# Patient Record
Sex: Female | Born: 1935 | Race: White | Hispanic: No | Smoking: Former smoker
Health system: Southern US, Community
[De-identification: ages and names within clinical notes are randomized; demographics above are authoritative.]

## PROBLEM LIST (undated history)

## (undated) DIAGNOSIS — T7840XA Allergy, unspecified, initial encounter: Secondary | ICD-10-CM

## (undated) DIAGNOSIS — C801 Malignant (primary) neoplasm, unspecified: Secondary | ICD-10-CM

## (undated) DIAGNOSIS — J449 Chronic obstructive pulmonary disease, unspecified: Secondary | ICD-10-CM

## (undated) DIAGNOSIS — M1990 Unspecified osteoarthritis, unspecified site: Secondary | ICD-10-CM

## (undated) DIAGNOSIS — S2231XD Fracture of one rib, right side, subsequent encounter for fracture with routine healing: Secondary | ICD-10-CM

## (undated) DIAGNOSIS — N200 Calculus of kidney: Secondary | ICD-10-CM

## (undated) HISTORY — PX: TONSILLECTOMY: SUR1361

## (undated) HISTORY — DX: Allergy, unspecified, initial encounter: T78.40XA

## (undated) HISTORY — DX: Fracture of one rib, right side, subsequent encounter for fracture with routine healing: S22.31XD

## (undated) HISTORY — PX: APPENDECTOMY: SHX54

---
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
## 1972-03-20 HISTORY — PX: RETINAL DETACHMENT SURGERY: SHX105

## 1999-10-11 ENCOUNTER — Encounter: Admission: RE | Admit: 1999-10-11 | Discharge: 1999-10-11 | Payer: Self-pay | Admitting: Family Medicine

## 1999-10-11 ENCOUNTER — Encounter: Payer: Self-pay | Admitting: Family Medicine

## 1999-10-25 ENCOUNTER — Encounter: Admission: RE | Admit: 1999-10-25 | Discharge: 1999-10-25 | Payer: Self-pay | Admitting: Family Medicine

## 1999-10-25 ENCOUNTER — Ambulatory Visit (HOSPITAL_COMMUNITY): Admission: RE | Admit: 1999-10-25 | Discharge: 1999-10-25 | Payer: Self-pay | Admitting: Family Medicine

## 1999-10-26 ENCOUNTER — Encounter: Admission: RE | Admit: 1999-10-26 | Discharge: 1999-10-26 | Payer: Self-pay | Admitting: Family Medicine

## 2000-07-24 ENCOUNTER — Encounter: Payer: Self-pay | Admitting: Family Medicine

## 2000-07-24 ENCOUNTER — Encounter: Admission: RE | Admit: 2000-07-24 | Discharge: 2000-07-24 | Payer: Self-pay | Admitting: Family Medicine

## 2000-08-21 ENCOUNTER — Encounter: Admission: RE | Admit: 2000-08-21 | Discharge: 2000-08-21 | Payer: Self-pay | Admitting: Family Medicine

## 2002-08-19 ENCOUNTER — Encounter: Admission: RE | Admit: 2002-08-19 | Discharge: 2002-08-19 | Payer: Self-pay | Admitting: Family Medicine

## 2002-08-27 ENCOUNTER — Encounter: Admission: RE | Admit: 2002-08-27 | Discharge: 2002-08-27 | Payer: Self-pay | Admitting: Family Medicine

## 2002-08-27 ENCOUNTER — Encounter: Payer: Self-pay | Admitting: Family Medicine

## 2004-01-19 ENCOUNTER — Ambulatory Visit: Payer: Self-pay | Admitting: Family Medicine

## 2004-07-27 ENCOUNTER — Encounter: Admission: RE | Admit: 2004-07-27 | Discharge: 2004-07-27 | Payer: Self-pay | Admitting: Family Medicine

## 2004-08-02 ENCOUNTER — Ambulatory Visit: Payer: Self-pay | Admitting: Family Medicine

## 2004-11-18 ENCOUNTER — Encounter (INDEPENDENT_AMBULATORY_CARE_PROVIDER_SITE_OTHER): Payer: Self-pay | Admitting: *Deleted

## 2004-11-29 ENCOUNTER — Ambulatory Visit: Payer: Self-pay | Admitting: Family Medicine

## 2004-11-29 ENCOUNTER — Encounter (INDEPENDENT_AMBULATORY_CARE_PROVIDER_SITE_OTHER): Payer: Self-pay | Admitting: *Deleted

## 2004-11-30 ENCOUNTER — Encounter: Admission: RE | Admit: 2004-11-30 | Discharge: 2004-11-30 | Payer: Self-pay | Admitting: Family Medicine

## 2004-12-05 ENCOUNTER — Encounter: Admission: RE | Admit: 2004-12-05 | Discharge: 2004-12-05 | Payer: Self-pay | Admitting: Family Medicine

## 2005-01-03 ENCOUNTER — Ambulatory Visit: Payer: Self-pay | Admitting: Family Medicine

## 2005-06-27 ENCOUNTER — Ambulatory Visit: Payer: Self-pay | Admitting: Sports Medicine

## 2006-04-25 ENCOUNTER — Encounter: Admission: RE | Admit: 2006-04-25 | Discharge: 2006-04-25 | Payer: Self-pay | Admitting: Family Medicine

## 2006-05-09 ENCOUNTER — Encounter: Admission: RE | Admit: 2006-05-09 | Discharge: 2006-05-09 | Payer: Self-pay | Admitting: Family Medicine

## 2006-05-17 DIAGNOSIS — K219 Gastro-esophageal reflux disease without esophagitis: Secondary | ICD-10-CM | POA: Insufficient documentation

## 2006-05-17 DIAGNOSIS — M1990 Unspecified osteoarthritis, unspecified site: Secondary | ICD-10-CM

## 2006-05-17 DIAGNOSIS — E785 Hyperlipidemia, unspecified: Secondary | ICD-10-CM | POA: Insufficient documentation

## 2006-05-17 DIAGNOSIS — M899 Disorder of bone, unspecified: Secondary | ICD-10-CM | POA: Insufficient documentation

## 2006-05-17 DIAGNOSIS — M949 Disorder of cartilage, unspecified: Secondary | ICD-10-CM

## 2006-05-17 DIAGNOSIS — L578 Other skin changes due to chronic exposure to nonionizing radiation: Secondary | ICD-10-CM | POA: Insufficient documentation

## 2006-05-18 ENCOUNTER — Encounter (INDEPENDENT_AMBULATORY_CARE_PROVIDER_SITE_OTHER): Payer: Self-pay | Admitting: *Deleted

## 2006-05-24 ENCOUNTER — Encounter: Payer: Self-pay | Admitting: Family Medicine

## 2006-05-29 ENCOUNTER — Ambulatory Visit: Payer: Self-pay | Admitting: Family Medicine

## 2006-05-29 DIAGNOSIS — E663 Overweight: Secondary | ICD-10-CM

## 2006-05-29 DIAGNOSIS — F4321 Adjustment disorder with depressed mood: Secondary | ICD-10-CM

## 2006-05-29 LAB — CONVERTED CEMR LAB: Hemoglobin: 14.3 g/dL

## 2006-05-31 ENCOUNTER — Telehealth: Payer: Self-pay | Admitting: Family Medicine

## 2006-05-31 LAB — CONVERTED CEMR LAB
AST: 17 units/L (ref 0–37)
Albumin: 4.3 g/dL (ref 3.5–5.2)
Alkaline Phosphatase: 55 units/L (ref 39–117)
BUN: 19 mg/dL (ref 6–23)
CO2: 26 meq/L (ref 19–32)
Chloride: 107 meq/L (ref 96–112)
Cholesterol: 236 mg/dL — ABNORMAL HIGH (ref 0–200)
Glucose, Bld: 104 mg/dL — ABNORMAL HIGH (ref 70–99)
LDL Cholesterol: 142 mg/dL — ABNORMAL HIGH (ref 0–99)
Sodium: 143 meq/L (ref 135–145)
Total Protein: 7.1 g/dL (ref 6.0–8.3)

## 2007-05-28 ENCOUNTER — Ambulatory Visit: Payer: Self-pay | Admitting: Family Medicine

## 2008-02-04 ENCOUNTER — Telehealth: Payer: Self-pay | Admitting: Family Medicine

## 2008-02-26 ENCOUNTER — Encounter: Payer: Self-pay | Admitting: Family Medicine

## 2008-02-26 ENCOUNTER — Ambulatory Visit: Payer: Self-pay | Admitting: Family Medicine

## 2008-02-26 LAB — CONVERTED CEMR LAB
Albumin: 4.2 g/dL (ref 3.5–5.2)
BUN: 21 mg/dL (ref 6–23)
Cholesterol: 257 mg/dL — ABNORMAL HIGH (ref 0–200)
Creatinine, Ser: 0.87 mg/dL (ref 0.40–1.20)
HDL: 67 mg/dL (ref >39)
Hemoglobin: 13.5 g/dL (ref 12.0–15.0)
LDL Cholesterol: 164 mg/dL — ABNORMAL HIGH (ref 0–99)
Potassium: 4.3 meq/L (ref 3.5–5.3)
RBC: 4.47 M/uL (ref 3.87–5.11)
Total Bilirubin: 0.5 mg/dL (ref 0.3–1.2)
Total CHOL/HDL Ratio: 3.8
Total Protein: 6.7 g/dL (ref 6.0–8.3)

## 2008-03-03 ENCOUNTER — Ambulatory Visit: Payer: Self-pay | Admitting: Family Medicine

## 2008-03-03 ENCOUNTER — Encounter: Admission: RE | Admit: 2008-03-03 | Discharge: 2008-03-03 | Payer: Self-pay | Admitting: Family Medicine

## 2008-03-03 DIAGNOSIS — L723 Sebaceous cyst: Secondary | ICD-10-CM | POA: Insufficient documentation

## 2008-03-03 DIAGNOSIS — M6740 Ganglion, unspecified site: Secondary | ICD-10-CM | POA: Insufficient documentation

## 2009-05-25 ENCOUNTER — Encounter: Admission: RE | Admit: 2009-05-25 | Discharge: 2009-05-25 | Payer: Self-pay | Admitting: Family Medicine

## 2009-06-01 ENCOUNTER — Ambulatory Visit: Payer: Self-pay | Admitting: Family Medicine

## 2009-06-01 DIAGNOSIS — M479 Spondylosis, unspecified: Secondary | ICD-10-CM | POA: Insufficient documentation

## 2009-06-01 LAB — CONVERTED CEMR LAB
ALT: 16 units/L (ref 0–35)
AST: 21 units/L (ref 0–37)
Alkaline Phosphatase: 55 units/L (ref 39–117)
Bilirubin Urine: NEGATIVE
CO2: 27 meq/L (ref 19–32)
Chloride: 103 meq/L (ref 96–112)
Cholesterol: 229 mg/dL — ABNORMAL HIGH (ref 0–200)
Creatinine, Ser: 1.07 mg/dL (ref 0.40–1.20)
Hemoglobin: 14 g/dL (ref 12.0–15.0)
Ketones, urine, test strip: NEGATIVE
Potassium: 4.1 meq/L (ref 3.5–5.3)
Protein, U semiquant: NEGATIVE
RDW: 13.7 % (ref 11.5–15.5)
Total CHOL/HDL Ratio: 3.3
Total CK: 55 units/L (ref 7–177)
Triglycerides: 134 mg/dL (ref <150)
Vit D, 25-Hydroxy: 38 ng/mL (ref 30–89)
pH: 8

## 2009-06-02 ENCOUNTER — Encounter: Payer: Self-pay | Admitting: Family Medicine

## 2009-06-23 ENCOUNTER — Encounter: Payer: Self-pay | Admitting: Family Medicine

## 2009-06-25 ENCOUNTER — Encounter: Payer: Self-pay | Admitting: Family Medicine

## 2009-06-25 LAB — CONVERTED CEMR LAB: OCCULT 3: NEGATIVE

## 2009-07-12 ENCOUNTER — Ambulatory Visit: Payer: Self-pay | Admitting: Family Medicine

## 2009-08-05 ENCOUNTER — Ambulatory Visit: Payer: Self-pay | Admitting: Family Medicine

## 2009-08-05 ENCOUNTER — Encounter: Admission: RE | Admit: 2009-08-05 | Discharge: 2009-08-05 | Payer: Self-pay | Admitting: Family Medicine

## 2009-08-09 ENCOUNTER — Telehealth: Payer: Self-pay | Admitting: Family Medicine

## 2009-08-09 DIAGNOSIS — R930 Abnormal findings on diagnostic imaging of skull and head, not elsewhere classified: Secondary | ICD-10-CM

## 2009-08-23 ENCOUNTER — Encounter: Admission: RE | Admit: 2009-08-23 | Discharge: 2009-08-23 | Payer: Self-pay | Admitting: Family Medicine

## 2010-01-23 ENCOUNTER — Encounter: Payer: Self-pay | Admitting: Family Medicine

## 2010-03-20 HISTORY — PX: SQUAMOUS CELL CARCINOMA EXCISION: SHX2433

## 2010-04-10 ENCOUNTER — Encounter: Payer: Self-pay | Admitting: Family Medicine

## 2010-04-19 NOTE — Assessment & Plan Note (Signed)
Summary: problem review   

## 2010-04-19 NOTE — Assessment & Plan Note (Signed)
Summary: continued cough   Vital Signs:  Patient profile:   77 year old female Height:      64 inches Weight:      174 pounds BMI:     29.97 BSA:     1.85 O2 Sat:      97 % on Room air Temp:     98.0 degrees F Pulse rate:   76 / minute BP sitting:   148 / 85  Vitals Entered By: Golden Circle, RN (Aug 05, 2009 8:30 AM)  O2 Flow:  Room air CC: f/u SOB Is Patient Diabetic? No Pain Assessment Patient in pain? yes      Intensity: 5   CC:  f/u SOB.  History of Present Illness: Pt reports that cough is a little better. It has moved down in her chest and is coughing up green stuff.  Feels fatigued.  Still with some coughing.  NO fevers, but some chills.  Feels SOB.  Has pain in chest assoc with cough.  + wheezing.  No hemoptyisis.  Decreased appetite.  Dry foods cause the coughing spells.  Still with coughing spells like before, but less frequent.  + voice changes, now  hoarse.   Quit smoking 17 years ago.  Husband, who died of lung CA, was diagnosed exactly 17 years after he quit smoking.  Pt quite concerned about her lack of energy, feels she just can't do the things she wants to do such as yard work and caring for her house.  Allergies: 1)  ! Sulfa 2)  ! Pneumovax 23 3)  Codeine  Past History:  Past medical, family and social histories (including risk factors) reviewed, and no changes noted (except as noted below).  Past Medical History: Reviewed history from 05/24/2006 and no changes required. BMI 28.2 (26.9 on 6/04) - 01/19/2004,  TSH normal - 07/30/2000,  Xray - L A-C joint+ - 07/24/2000  Past Surgical History: Reviewed history from 05/31/2006 and no changes required. Tonsillectomy as a child  L breast bx benign - 03/20/1952 Retinal detachment R eye - 1974  Family History: Reviewed history from 07/12/2009 and no changes required. Heart, DM - MGF HTN - F who is living at age 57 with only mild memory loss, s/p Parathyroidectomy  M age 71 GERD & osteoporosis  Aunt  in 31s with 2 stents placed  Social History: Reviewed history from 07/12/2009 and no changes required. Widowed; Husband died of lung CA 18-Aug-1997; dates a friend Her mother, born 10, lives nearby. Father born 65 lives in [place], [place].  Daughter, lives in [place] Retired from Dealer office  Quit smoking 1994  Review of Systems       see HPI  Physical Exam  General:  Well-developed,well-nourished,in no acute distress; alert,appropriate and cooperative throughout examination.  Vitals noted Mouth:  Cobblestoning in post phayynx. O/w normal exam with good dentition, MMM Lungs:  Pre BJY:NWGNFA respiratory effort, chest expands symmetrically. No crackles, but scattered exp wheezes throughout. No dullness to percussion noted. Post neb: Still with occ wheezes, but improved. Heart:  Normal rate and regular rhythm. S1 and S2 normal without gallop, murmur, click, rub or other extra sounds.   Impression & Recommendations:  Problem # 1:  COUGH (ICD-786.2) Pt with continued cough for just over 4 weeks.  Given her paroxysmal coughing, pertussis is in differential.  H/o of smoking is concerning for possible malignancy.  Will rx azithromycin, though unlikely to be of benefit even if this is pertussis.  Check CXR given her  prolonged symptoms.  F/u 1 week with either Dr. Sheffield Slider or me.  Call us sooner if worsens. Since she did improve with neb treatment, will rx albuterol and spacer.   Orders: Albuterol Sulfate Sol 1mg  unit dose (Z6109) CXR- 2view (CXR) [facility]- Est Level  3 (60454)  Complete Medication List: 1)  Biotin 5 Mg Caps (Biotin) .... Take one tablet daily 2)  Caltrate 600+d 600-400 Mg-unit Tabs (Calcium carbonate-vitamin d) .... Take 1 tab two times a day 3)  Fish Oil 1000 Mg Caps (Omega-3 fatty acids) .... Take 1 tab two times a day 4)  Flax Seed Oil 1000 Mg Caps (Flaxseed (linseed)) .... Take one daily 5)  Advil 200 Mg Tabs (Ibuprofen) .... Take 2 at bedtime. 6)  Anacin 400-32 Mg Tabs  (Aspirin-caffeine) .... Take 1 tab every a.m. 7)  Tessalon Perles 100 Mg Caps (Benzonatate) .Marland Kitchen.. 1-2 by mouth three times a day as needed for cough 8)  Azithromycin 250 Mg Tabs (Azithromycin) .... Take 2 by mouth today, then 1 by mouth daily for 4 more days. 9)  Ventolin Hfa 108 (90 Base) Mcg/act Aers (Albuterol sulfate) .... 2 puffs with space every 6 hours as needed for wheezing 10)  Optichamber Advantage Misc (Spacer/aero-holding chambers) .... Use with inhaler  Patient Instructions: 1)  Please come see me or Dr. Sheffield Slider next week. 2)  Try the antibiotics.  You should take them for 5 days total. 3)  Try the inhaler to help with your breathing. 4)  If you feel worse, please call us or go to urgent care. 5)  I will check on your chest x-ray results. Prescriptions: OPTICHAMBER ADVANTAGE  MISC (SPACER/AERO-HOLDING CHAMBERS) Use with inhaler  #1 x 0   Entered and Authorized by:   Sarah Swaziland MD   Signed by:   Sarah Swaziland MD on 08/05/2009   Method used:   Print then Give to Patient   RxID:   [id number] VENTOLIN HFA 108 (90 BASE) MCG/ACT AERS (ALBUTEROL SULFATE) 2 puffs with space every 6 hours as needed for wheezing  #1 x 1   Entered and Authorized by:   Sarah [place] MD   Signed by:   Sarah [place]azil[place] MD on 08/05/2009   Method used:   Print then Give to Patient   RxID:   [id number] AZITHROMYCIN 250 MG TABS (AZITHROMYCIN) take 2 by mouth today, then 1 by mouth daily for 4 more days.  #6 x 0   Entered and Authorized by:   Sarah [place]az[place] MD   Signed by:   Sarah [place]azil[place] MD on 08/05/2009   Method used:   Print then Give to Patient   RxID:   [id number]    Medication Administration  Medication # 1:    Medication: Albuterol Sulfate Sol 1mg  unit dose    Diagnosis: COUGH (ICD-786.2)    Dose: 2.5 mg    Route: inhaled    Exp Date: 02/18/2011    Lot #: [id number]    Mfr: Nephron    Patient tolerated medication without complications    Given by: Garen Grams LPN (Aug 05, 2009 8:51  AM)  Orders Added: 1)  Albuterol Sulfate Sol 1mg  unit dose [J7613] 2)  CXR- 2view [CXR] 3)  [facility]- Est Level  3 [42595]

## 2010-04-19 NOTE — Assessment & Plan Note (Signed)
Summary: coughing,tcb   Vital Signs:  Patient profile:   77 year old female Height:      64 inches Weight:      173 pounds BMI:     29.80 Temp:     97.9 degrees F oral Pulse rate:   112 / minute BP sitting:   150 / 83  (left arm) Cuff size:   regular  Vitals Entered By: Tessie Fass CMA (July 12, 2009 11:31 AM) CC: chronic cough x 8 days Is Patient Diabetic? No Pain Assessment Patient in pain? no        CC:  chronic cough x 8 days.  History of Present Illness: 77 yo female recently returned from a cruise here with cough for 8 days.  Non-productive.  Feels like it's tight in her throat, cough caused when she takes a deep breath and then cough reflex starts.  Cough prolonged when she does have it.  No posttussive emesis.  + tears in eyes because she is coughing so hard.   No fevers (low grade temp for a few days to 100).  NO myalgias, no rhinorrhea. No chest pain or dyspnea.  Denies postnasal drip.  Habits & Providers  Alcohol-Tobacco-Diet     Tobacco Status: quit  Current Medications (verified): 1)  Biotin 5 Mg Caps (Biotin) .... Take One Tablet Daily 2)  Caltrate 600+d 600-400 Mg-Unit Tabs (Calcium Carbonate-Vitamin D) .... Take 1 Tab Two Times A Day 3)  Fish Oil 1000 Mg Caps (Omega-3 Fatty Acids) .... Take 1 Tab Two Times A Day 4)  Flax Seed Oil 1000 Mg Caps (Flaxseed (Linseed)) .... Take One Daily 5)  Advil 200 Mg Tabs (Ibuprofen) .... Take 2 At Bedtime. 6)  Anacin 400-32 Mg Tabs (Aspirin-Caffeine) .... Take 1 Tab Every A.m.  Allergies (verified): 1)  ! Sulfa 2)  ! Pneumovax 23 3)  Codeine  Family History: Heart, DM - MGM HTN - F who is living at age 22 with only mild memory loss, s/p Parathyroidectomy  M age 27 GERD & osteoporosis   Social History: Widowed; Husband died of lung CA 08/16/1997; dates a friend Her mother, born 65, lives nearby. Father born 27 lives in [place], [place].  Daughter, lives in [place] Retired from Dealer office  Quit smoking  1994  Review of Systems       see HPI  Physical Exam  General:  Well-developed,well-nourished,in no acute distress; alert,appropriate and cooperative throughout examination.  Appears younger than stated age. Head:  Normocephalic and atraumatic without obvious abnormalities. No apparent alopecia or balding. Mouth:  Oral mucosa and oropharynx without lesions or exudates.  Teeth in good repair. Neck:  No deformities, masses, or tenderness noted. Lungs:  Normal respiratory effort, chest expands symmetrically. Lungs are clear to auscultation, no crackles or wheezes. Heart:  Normal rate and regular rhythm. S1 and S2 normal without gallop, murmur, click, rub or other extra sounds.   Impression & Recommendations:  Problem # 1:  COUGH (ICD-786.2)  Paroxysmal cough in former smoker- unclear etiology.  Denies viral antecedant and allergy signs.  May try Tessalon Perles.  If post-viral, should have some improvement in 1 week or so (though cautioned cough may persist for weeks).  Pt will contact us if no improvement at all in 1 week.  Orders: [facility]- Est Level  3 ([address])  Complete Medication List: 1)  Biotin 5 Mg Caps (Biotin) .... Take one tablet daily 2)  Caltrate 600+d 600-400 Mg-unit Tabs (Calcium carbonate-vitamin d) .... Take 1 tab  two times a day 3)  Fish Oil 1000 Mg Caps (Omega-3 fatty acids) .... Take 1 tab two times a day 4)  Flax Seed Oil 1000 Mg Caps (Flaxseed (linseed)) .... Take one daily 5)  Advil 200 Mg Tabs (Ibuprofen) .... Take 2 at bedtime. 6)  Anacin 400-32 Mg Tabs (Aspirin-caffeine) .... Take 1 tab every a.m. 7)  Tessalon Perles 100 Mg Caps (Benzonatate) .Marland Kitchen.. 1-2 by mouth three times a day as needed for cough  Patient Instructions: 1)  This cough will likely get better.  Let us know if it has not improved at all in 1 week. 2)  You can try the [facility] for your cough. 3)  It was nice to meet you. Prescriptions: TESSALON PERLES 100 MG CAPS (BENZONATATE) 1-2 by mouth  three times a day as needed for cough  #60 x 0   Entered and Authorized by:   Thaddius Manes [place] MD   Signed by:   Brittiny Levitz [place] MD on 07/12/2009   Method used:   Print then Give to Patient   RxID:   [id number]

## 2010-04-19 NOTE — Progress Notes (Signed)
Summary: orders for repeat cxr  Phone Note Outgoing Call   Call placed by: Tayt Moyers [place] MD,  Aug 09, 2009 8:45 AM Summary of Call: Discussed cxr results with pt.  She is feeling much better now.  Less coughing, more energy (worked in yard yesterday).  Using albuterol about two times a day.  Will plan to repeat CXR in 3 weeks to follow up density.  Will send order over to [facility] for repeat.  Pt feels she can cancel her appt for Thursday, but knows to call us if she feels any worse. Initial call taken by: Salvator Seppala [place] MD,  Aug 09, 2009 8:47 AM  New Problems: ABNORMAL CHEST XRAY (ICD-793.1)   New Problems: ABNORMAL CHEST XRAY (ICD-793.1)

## 2010-04-19 NOTE — Assessment & Plan Note (Signed)
Summary: f/u,df   Vital Signs:  Patient profile:   77 year old female Height:      64 inches Weight:      173 pounds BMI:     29.80 Pulse rate:   70 / minute BP sitting:   160 / 80  (left arm) Cuff size:   regular  Vitals Entered By: Arlyss Repress CMA, (June 01, 2009 8:31 AM)  Serial Vital Signs/Assessments:  Time      Position  BP       Pulse  Resp  Temp     By 9:22 AM             144/68                         Arlyss Repress CMA,  CC: physical.  neck muscles tighten up shoulders. had bladder spasm 3 weeks ago. wakes up with muscle pain off and on x 1 year Is Patient Diabetic? No Pain Assessment Patient in pain? no        CC:  physical.  neck muscles tighten up shoulders. had bladder spasm 3 weeks ago. wakes up with muscle pain off and on x 1 year.  History of Present Illness: Had a tough year in 08/24/08 due to her mother, now 65, falling in the spring leading to prolonged rehab and the need for an aide. Her mother was mean to the aide and didn't appreciate Mrs Travaglini. She lives independently again close to her and is back to her old self. This all made Mrs Cronk depressed and prevented exercise so she's surprised that she lost weight. Is exercising again.   Going on a cruise and would like the sea sickness patch which helped last time.   One episode of "bladder spasm" after voiding. No urinary symptoms since.   Continues taking 2 Advil at bedtime which helps sleep. Has generalized muscle aches. Tight and sore in right neck muscles. Sometimes awakens with this at 3 AM. 30 years ago had period of cervical pain and spasm and told she had a C7 spur problem. Had numbness in middle fingers of the left hand then, but not now.   Takes an occasional Tums for dyspepsia  Declines colonoscopy (2 friends with perf) and zoster shot.     Habits & Providers  Alcohol-Tobacco-Diet     Tobacco Status: quit     Year Quit: 08/24/1992  Current Medications (verified): 1)  Biotin 5 Mg Caps  (Biotin) .... Take One Tablet Daily 2)  Caltrate 600+d 600-400 Mg-Unit Tabs (Calcium Carbonate-Vitamin D) .... Take 1 Tab Two Times A Day 3)  Fish Oil 1000 Mg Caps (Omega-3 Fatty Acids) .... Take 1 Tab Two Times A Day 4)  Flax Seed Oil 1000 Mg Caps (Flaxseed (Linseed)) .... Take One Daily 5)  Advil 200 Mg Tabs (Ibuprofen) .... Take 2 At Bedtime. 6)  Anacin 400-32 Mg Tabs (Aspirin-Caffeine) .... Take 1 Tab Every A.m. 7)  Transderm-Scop 1.5 Mg Pt72 (Scopolamine Base) .... Apply One Patch Every 72 Hours  Allergies (verified): 1)  ! Sulfa 2)  ! Pneumovax 23 3)  Codeine  Social History: Widowed; Husband died of lung CA 08/24/97; dates a friend Her mother, born 85, lives nearby. Father born 19 lives in [place], [place].  Daughter, lives in [place] Retired from Dealer office  Quit smoking 1994; Rare alcohol  Physical Exam  General:  Well-developed,well-nourished,in no acute distress; alert,appropriate and cooperative throughout examination Neck:  No  deformities, masses, or tenderness noted. No pain with rotation or Spurlings.  Lungs:  Normal respiratory effort, chest expands symmetrically. Lungs are clear to auscultation, no crackles or wheezes. Heart:  Normal rate and regular rhythm. S1 and S2 normal without gallop, murmur, click, rub or other extra sounds. Abdomen:  Bowel sounds positive,abdomen soft and non-tender without masses, organomegaly or hernias noted. Neurologic:  alert & oriented X3 and strength normal in upper extremities. , and DTRs symmetrical and norma in UEl.     Impression & Recommendations:  Problem # 1:  OTHER SYMPTOMS INVOLVING URINARY SYSTEM (ICD-788.99) No indications of disease Orders: Urinalysis-[facility] (00000)  Problem # 2:  ELEVATED BP READING WITHOUT DX HYPERTENSION (ICD-796.2) Still elevated Orders: [facility]- Est  Level 4 (99214) Comp Met-[facility] ([address])  Problem # 3:  GANGLION CYST, WRIST, RIGHT (ICD-727.41) unchanged, without symptoms   Problem # 4:   IMPAIRED GLUCOSE TOLERANCE (ICD-271.3) improved  Problem # 5:  MOTION SICKNESS (ICD-994.6) Scopalamine patch Orders: [facility]- Est  Level 4 (99214)  Problem # 6:  OBESITY NOS (ICD-278.00) Assessment: Improved  Problem # 7:  OSTEOARTHRITIS, MULTI SITES (ICD-715.98) No sign of muscle disease Her updated medication list for this problem includes:    Advil 200 Mg Tabs (Ibuprofen) .Marland Kitchen... Take 2 at bedtime.    Anacin 400-32 Mg Tabs (Aspirin-caffeine) .Marland Kitchen... Take 1 tab every a.m.  Orders: [facility]- Est  Level 4 (99214) Sed Rate (ESR)-[facility] (85651) CK (Creatine Kinase)-[facility] ([address]50-23250) Vit D, 25 OH-[facility] ([address]11-91478)  Problem # 8:  HYPERLIPIDEMIA (ICD-272.4) Assessment: Improved  Orders: [facility]- Est  Level 4 (99214) Lipid-[facility] (29562-13086) Comp Met-[facility] (57846-96295)  Problem # 9:  DEGENERATIVE JOINT DISEASE, CERVICAL SPINE (ICD-721.90) Likely radicular irritation with local spasm. Encouraged proper posture and continue towel pillow.   Complete Medication List: 1)  Biotin 5 Mg Caps (Biotin) .... Take one tablet daily 2)  Caltrate 600+d 600-400 Mg-unit Tabs (Calcium carbonate-vitamin d) .... Take 1 tab two times a day 3)  Fish Oil 1000 Mg Caps (Omega-3 fatty acids) .... Take 1 tab two times a day 4)  Flax Seed Oil 1000 Mg Caps (Flaxseed (linseed)) .... Take one daily 5)  Advil 200 Mg Tabs (Ibuprofen) .... Take 2 at bedtime. 6)  Anacin 400-32 Mg Tabs (Aspirin-caffeine) .... Take 1 tab every a.m. 7)  Transderm-scop 1.5 Mg Pt72 (Scopolamine base) .... Apply one patch every 72 hours  Other Orders: CBC-[facility] (28413)  Patient Instructions: 1)  Please schedule a follow-up appointment in 2 months. For follow-up of lab tests. 2)  I will call with lab results if they are abnormal Prescriptions: TRANSDERM-SCOP 1.5 MG PT72 (SCOPOLAMINE BASE) Apply one patch every 72 hours  #5 x 1   Entered and Authorized by:   Zachery Dauer MD   Signed by:   Zachery Dauer MD on 06/01/2009   Method used:   Print then Give to  Patient   RxID:   [id number]    Prevention & Chronic Care Immunizations   Influenza vaccine: Not documented   Influenza vaccine due: Refused  (03/03/2008)    Tetanus booster: 07/18/2004: Done.   Tetanus booster due: 07/19/2014    Pneumococcal vaccine: Done.  (10/19/1999)   Pneumococcal vaccine due: None    H. zoster vaccine: Not documented   H. zoster vaccine deferral: Refused  (06/01/2009)  Colorectal Screening   Hemoccult: Negative  (06/18/2004)   Hemoccult action/deferral: Refused  (06/01/2009)   Hemoccult due: 06/18/2005    Colonoscopy: Not documented   Colonoscopy due: Refused  (03/03/2008)  Other Screening   Pap smear: Done.  (11/18/2004)   Pap smear due: Not Indicated    Mammogram: ASSESSMENT: Negative - BI-RADS 1^MM DIGITAL SCREENING  (05/25/2009)   Mammogram due: 05/26/2010    DXA bone density scan: Done.  (11/18/2004)   DXA scan due: None    Smoking status: quit  (06/01/2009)  Lipids   Total Cholesterol: 257  (02/26/2008)   LDL: 164  (02/26/2008)   LDL Direct: Not documented   HDL: 67  (02/26/2008)   Triglycerides: 131  (02/26/2008)    SGOT (AST): 20  (02/26/2008)   SGPT (ALT): 17  (02/26/2008) CMP ordered    Alkaline phosphatase: 48  (02/26/2008)   Total bilirubin: 0.5  (02/26/2008)    Lipid flowsheet reviewed?: Yes   Progress toward LDL goal: Deteriorated  Self-Management Support :   Personal Goals (by the next clinic visit) :      Personal LDL goal: 130  (06/01/2009)    Patient will work on the following items until the next clinic visit to reach self-care goals:     Medications and monitoring: check my blood pressure  (06/01/2009)     Eating: eat fruit for snacks and desserts  (06/01/2009)     Activity: take a 30 minute walk every day  (06/01/2009)    Lipid self-management support: Not documented    Laboratory Results   Urine Tests  Date/Time Received: June 01, 2009 9:23 AM  Date/Time Reported: June 01, 2009 9:46  AM   Routine Urinalysis   Color: yellow Appearance: Clear Glucose: negative   (Normal Range: Negative) Bilirubin: negative   (Normal Range: Negative) Ketone: negative   (Normal Range: Negative) Spec. Gravity: 1.015   (Normal Range: 1.003-1.035) Blood: negative   (Normal Range: Negative) pH: 8.0   (Normal Range: 5.0-8.0) Protein: negative   (Normal Range: Negative) Urobilinogen: 0.2   (Normal Range: 0-1) Nitrite: negative   (Normal Range: Negative) Leukocyte Esterace: negative   (Normal Range: Negative)    Comments: ...........test performed by...........Marland KitchenTerese Door, CMA   Blood Tests   Date/Time Received: June 01, 2009 9:23 AM  Date/Time Reported: June 01, 2009 10:48 AM   SED rate: 5 mm/hr  Comments: ...............test performed by......Marland KitchenBonnie A. [place], MLS (ASCP)cm       Appended Document: report Urgent Care visit. Brought a report from [facility] in [place] where she went for sore throat. BP was 138/78 Strep neg diagnosis laryngopharyngitis. prescription Zithromax x 5 days. Prednisone 20 mg. She didn't take the Prednisone.    Clinical Lists Changes       Appended Document: BP from home BP from home 2/18 - 3/2 [id number]

## 2010-04-19 NOTE — Letter (Signed)
Summary: Results Follow-up Letter  [facility]  [address]   [place], [place] [address]   Phone: [phone]  Fax: [phone]    06/25/2009  [address] [place], [place]  [address]  Dear Ms. Demicco,   The following are the results of your recent test(s): The tests for blood in your stools were all negative. We should repeat it in a year.   Sincerely,  Zachery Dauer MD [facility]           Appended Document: Results Follow-up Letter mailed.

## 2010-04-19 NOTE — Letter (Signed)
Summary: Results Follow-up Letter  [facility]  [address]   [place], [place] [address]   Phone: [phone]  Fax: [phone]    06/02/2009  [address] [place], [place]  [address]  Dear Ms. Ekstrom,   The following are the results of your recent test(s): Patient: Sara Stephenson Your blood count is normal  Tests: (1) CBC NO Diff (Complete Blood Count) (10000)   Order Note: FASTING   WBC                       7.0 K/uL                    4.0-10.5   RBC                       4.73 MIL/uL                 3.87-5.11   Hemoglobin                14.0 g/dL                   41.3-24.4   Hematocrit                44.3 %                      36.0-46.0   MCV                       93.7 fL                     78.0-100.0 ! MCH                       29.6 pg                     26.0-34.0   MCHC                      31.6 g/dL                   01.0-27.2   RDW                       13.7 %                      11.5-15.5   Platelet Count            234 K/uL                    150-400      Your kidney and liver function are normal. Blood sugar is improved. Tests: (2) Comprehensive Metabolic Panel (53664)   Sodium                    143 mEq/L                   135-145   Potassium                 4.1 mEq/L                   3.5-5.3   Chloride                  103  mEq/L                   96-112   CO2                       27 mEq/L                    19-32   Glucose                   99 mg/dL                    04-54   BUN                       20 mg/dL                    0-98   Creatinine                1.07 mg/dL                  0.40-1.20   Bilirubin, Total          0.5 mg/dL                   1.1-9.1   Alkaline Phosphatase      55 U/L                      39-117   AST/SGOT                  21 U/L                      0-37   ALT/SGPT                  16 U/L                      0-35   Total Protein             7.0 g/dL                    4.7-8.2   Albumin                   4.4  g/dL                    9.5-6.2   Calcium                   9.6 mg/dL                   1.3-08.6  Tests: (3) Lipid Profile (57846)   Cholesterol          [H]  229 mg/dL                   9-629     ATP III Classification:           < 200        mg/dL        Desirable          200 - 239     mg/dL        Borderline High          >= 240        mg/dL  High         Triglyceride              134 mg/dL                   <161   HDL Cholesterol           69 mg/dL                    >09   Total Chol/HDL Ratio      3.3 Ratio  VLDL Cholesterol (Calc)                             27 mg/dL                    6-04  LDL Cholesterol (Calc)                        [H]  133 mg/dL                   5-40           Total Cholesterol/HDL Ratio:CHD Risk                            Coronary Heart Disease Risk Table                                            Men       Women              1/2 Average Risk              3.4        3.3                  Average Risk              5.0        4.4              2 X Average Risk              9.6        7.1              3 X Average Risk             23.4       11.0     Use the calculated Patient Ratio above and the CHD Risk table      to determine the patient's CHD Risk.     ATP III Classification (LDL):           < 100        mg/dL         Optimal          100 - 129     mg/dL         Near or Above Optimal          130 - 159     mg/dL         Borderline High          160 - 189     mg/dL         High           >  190        mg/dL         Very High Your cholesterol is improved.      Tests: (4) CK, Total (60454)   CK, Total                 55 U/L                      7-177 No indication of muscle disease. Tests: (5) Vitamin D (25-Hydroxy) (09811)  Vitamin D (25-Hydroxy)                             38 ng/mL                    30-89     This assay accurately quantifies Vitamin D, which is the sum of the     25-Hydroxy forms of Vitamin D2 and D3.  Studies have shown  that the     optimum concentration of 25-Hydroxy Vitamin D is 30 ng/mL or higher.      Concentrations of Vitamin D between 20 and 29 ng/mL are considered to     be insufficient and concentrations less than 20 ng/mL are considered     to be deficient for Vitamin D.  Your have adequate Vitamin D. Take 1200 mg of calcium and 800 International Units  of vitamin D daily to keep your bones strong. Document Creation Date: 06/02/2009 12:52 AM Cholesterol LDL(Bad cholesterol):          Your goal is less than:     130    HDL (Good cholesterol):        Your goal is more than:   45 _________________________________________________________  Please bring a list of home blood pressure measurements to your next visit.   Sincerely,  Zachery Dauer MD [facility]            Appended Document: Results Follow-up Letter mailed.

## 2010-05-19 DIAGNOSIS — S2231XD Fracture of one rib, right side, subsequent encounter for fracture with routine healing: Secondary | ICD-10-CM

## 2010-05-19 HISTORY — DX: Fracture of one rib, right side, subsequent encounter for fracture with routine healing: S22.31XD

## 2010-05-30 ENCOUNTER — Encounter: Payer: Self-pay | Admitting: Home Health Services

## 2011-02-01 ENCOUNTER — Encounter: Payer: Self-pay | Admitting: Home Health Services

## 2011-02-27 ENCOUNTER — Encounter: Payer: Self-pay | Admitting: Family Medicine

## 2011-04-10 ENCOUNTER — Encounter: Payer: Self-pay | Admitting: Family Medicine

## 2011-05-25 ENCOUNTER — Encounter: Payer: Self-pay | Admitting: Family Medicine

## 2011-05-25 ENCOUNTER — Ambulatory Visit
Admission: RE | Admit: 2011-05-25 | Discharge: 2011-05-25 | Disposition: A | Discharge status: Home / Self Care | Payer: Medicare Other | Source: Ambulatory Visit | Attending: Family Medicine | Admitting: Family Medicine

## 2011-05-25 ENCOUNTER — Ambulatory Visit (INDEPENDENT_AMBULATORY_CARE_PROVIDER_SITE_OTHER): Payer: Medicare Other | Admitting: Family Medicine

## 2011-05-25 VITALS — BP 135/80 | HR 76 | Ht 64.5 in | Wt 174.0 lb

## 2011-05-25 DIAGNOSIS — R0781 Pleurodynia: Secondary | ICD-10-CM

## 2011-05-25 DIAGNOSIS — R079 Chest pain, unspecified: Secondary | ICD-10-CM

## 2011-05-25 DIAGNOSIS — S2239XA Fracture of one rib, unspecified side, initial encounter for closed fracture: Secondary | ICD-10-CM | POA: Insufficient documentation

## 2011-05-25 MED ORDER — TRAMADOL HCL 50 MG PO TABS: 50.0000 mg | ORAL_TABLET | Freq: Three times a day (TID) | ORAL | Status: DC | PRN

## 2011-05-25 NOTE — Patient Instructions (Signed)
Make a follow-up appt with Dr. Sheffield Slider  I will call you with results of xray.  Rib Fracture The ribs are like a cage that goes around your upper chest. The ribs protect your lungs. Your ribs move when you breathe, so it hurts if a rib is broken. HOME CARE  Avoid activities that cause pain to the injured area. Protect your injured area.   Eat a normal, healthy diet.   Drink enough fluids to keep your pee (urine) clear or pale yellow.   Take deep breaths many times a day. Cough many times a day while hugging a pillow.   Do not wear a rib belt or binder on the chest unless told by your doctor. Rib belts or binders do not allow you to breathe deeply.   Only take medicine as told by your doctor.  GET HELP RIGHT AWAY IF:    You have a fever.   You cannot stop coughing or cough up thick or bloody spit (mucus).   You have trouble breathing.   You feel sick to your stomach (nauseous), throw up (vomit), or have belly (abdominal) pain.   Your pain gets worse and medicine does not help.  MAKE SURE YOU:    Understand these instructions.   Will watch this condition.   Will get help right away if you are not doing well or get worse.  Document Released: 12/14/2007 Document Revised: 02/23/2011 Document Reviewed: 12/14/2007 [facility] Patient Information 2012 [place], [place].

## 2011-05-25 NOTE — Assessment & Plan Note (Addendum)
Rib pain after fall, will obtain xray to evaluate for fracture.  Feels like motrin is not enough analgesia, will rx tramadol.  Advised will call her back with results, discussed continuing to take deep breaths, given red flags for return,  Update:  Isolated right 5th rib fracture.  Discussed with patient on phone.  She is getting good relief from tramadol. Again discussed importance of pain control, make concerted effort to take deep breaths.

## 2011-05-25 NOTE — Progress Notes (Signed)
  Subjective:    Patient ID: Sara Stephenson, female    DOB: November 21, 1932, 78 y.o.   MRN: [id number]  HPI 78 yo here for work in appt to evaluate right rib pain after fall.  2 days ago tripped on a "railroad tie" while walking up steps.  Fell onto her right elbow and ribcage.  Since then has has moderate to severe soreness of her right rib cage, not able to wear a braw as it is very tender.  Notes worse when taking deep breaths, but no dyspnea.  No brusing.  No head trauma, syncope.  She has not had a fall before.  No fever, chills, cough, sputum.  PMH sig for ? osteopenia  Review of Systems See HPI    Objective:   Physical Exam  GEN: Alert & Oriented, No acute distress CV:  Regular Rate & Rhythm, no murmur Respiratory:  Normal work of breathing, CTAB Chest:  No bruising.  Tender to palpation along rib under right breast       Assessment & Plan:

## 2011-05-25 NOTE — Progress Notes (Signed)
Addended by: Macy Mis on: 05/25/2011 02:59 PM   Modules accepted: Orders

## 2011-06-20 ENCOUNTER — Other Ambulatory Visit: Payer: Self-pay | Admitting: Family Medicine

## 2011-06-20 MED ORDER — TRAMADOL HCL 50 MG PO TABS: 50.0000 mg | ORAL_TABLET | Freq: Three times a day (TID) | ORAL | Status: DC | PRN

## 2011-06-20 NOTE — Telephone Encounter (Signed)
Calling to ask for refill for tramadol for sinus infection that is causing her to cough a lot at night.  Daughter said it helps her sleep.  Wanted to have refill sent to [facility] in [place]

## 2011-06-21 MED ORDER — TRAMADOL HCL 50 MG PO TABS: 50.0000 mg | ORAL_TABLET | Freq: Three times a day (TID) | ORAL | Status: DC | PRN

## 2011-07-27 ENCOUNTER — Ambulatory Visit (INDEPENDENT_AMBULATORY_CARE_PROVIDER_SITE_OTHER): Payer: Medicare Other | Admitting: Family Medicine

## 2011-07-27 VITALS — BP 150/81 | HR 84 | Temp 97.1°F | Ht 64.5 in | Wt 171.0 lb

## 2011-07-27 DIAGNOSIS — E669 Obesity, unspecified: Secondary | ICD-10-CM

## 2011-07-27 DIAGNOSIS — R0602 Shortness of breath: Secondary | ICD-10-CM

## 2011-07-27 DIAGNOSIS — M1990 Unspecified osteoarthritis, unspecified site: Secondary | ICD-10-CM

## 2011-07-27 DIAGNOSIS — E785 Hyperlipidemia, unspecified: Secondary | ICD-10-CM

## 2011-07-27 DIAGNOSIS — R930 Abnormal findings on diagnostic imaging of skull and head, not elsewhere classified: Secondary | ICD-10-CM

## 2011-07-27 LAB — CBC
MCHC: 32.6 g/dL (ref 30.0–36.0)
Platelets: 224 10*3/uL (ref 150–400)

## 2011-07-27 LAB — COMPLETE METABOLIC PANEL WITH GFR
BUN: 21 mg/dL (ref 6–23)
CO2: 28 mEq/L (ref 19–32)
Chloride: 106 mEq/L (ref 96–112)
Sodium: 142 mEq/L (ref 135–145)
Total Bilirubin: 0.4 mg/dL (ref 0.3–1.2)
Total Protein: 6.8 g/dL (ref 6.0–8.3)

## 2011-07-27 LAB — LIPID PANEL
LDL Cholesterol: 162 mg/dL — ABNORMAL HIGH (ref 0–99)
Total CHOL/HDL Ratio: 3.7 Ratio

## 2011-07-27 MED ORDER — TRAMADOL HCL 50 MG PO TABS: 50.0000 mg | ORAL_TABLET | Freq: Every evening | ORAL | Status: DC | PRN

## 2011-07-27 NOTE — Assessment & Plan Note (Signed)
Plan to assess lipids comprehensive metabolic panel today while fasting

## 2011-07-27 NOTE — Progress Notes (Signed)
Sara Stephenson is a 78 y.o. female who presents to [facility] today for of chronic medical issues.  1) shortness of breath: Patient notes her shortness of breath since she was wheezing after exertion. This is worsened over the last 9-12 months. For example she walked up 2 flights of stairs and noted more shortness of breath and wheezing and she normally would've noted. She denies any chest pains or palpitations edema or syncope.  After climbing 2 sets of stairs she could have climbed more if she needed to. She does make it fatigue when she walks on flat surfaces unless she is going fast.     2) arthritis: Patient notes arthritis of both knees and both ankles. She denies any significant joint effusion or pain that limits her activities. She does note aching at night.  She was given tramadol after her rib fracture and notes that on tramadol at night seems to resolve her nighttime aches and pains.  She denies any locking or catching of the knees or ankles. She wears supportive walking shoes when walking or exercising.  3) blood pressure: Patient checks her blood pressure frequently. Her home systolic blood pressure is never more than 130.  Her home systolic blood pressure today is 103.  No syncope noted   PMH: Reviewed significant for history of smoking until 1994 and pneumonia 2 years ago in rib fracture 2 months ago. History  Substance Use Topics  . Smoking status: Never Smoker   . Smokeless tobacco: Not on file  . Alcohol Use: Not on file   ROS as above  Medications reviewed. Current Outpatient Prescriptions  Medication Sig Dispense Refill  . Biotin 5 MG CAPS Take 1 capsule by mouth daily.       . Calcium Carbonate-Vitamin D (CALTRATE 600+D) 600-400 MG-UNIT per tablet Take 1 tablet by mouth 2 (two) times daily.        Marland Kitchen ibuprofen (ADVIL,MOTRIN) 200 MG tablet Take 400 mg by mouth at bedtime.       . Omega-3 Fatty Acids (FISH OIL) 1000 MG CAPS Take 1 capsule by mouth 2 (two) times daily.       .  traMADol (ULTRAM) 50 MG tablet       . Aspirin-Caffeine (ANACIN) 400-32 MG TABS Take 1 tablet by mouth every morning.         Exam:  BP 150/81  Pulse 84  Temp(Src) 97.1 F (36.2 C) (Oral)  Ht 5' 4.5" (1.638 m)  Wt 171 lb (77.565 kg)  BMI 28.90 kg/m2  SpO2 98% Gen: Well NAD HEENT: EOMI,  MMM, no JVD or bruit Lungs: CTABL Nl WOB. Patient developed a dry cough after 10 deep breaths. No wheezing or crackles noted Heart: RRR no soft late systolic nonradiating murmur at the left upper sternal border Abd: NABS, NT, ND Exts: Non edematous BL  LE, warm and well perfused. MSK: No joint effusions. Normal motion. Right knee range of motion with crepitus. Negative McMurray  bilaterally  Skin: multiple cherry angiomas and early seb K's over the trunk. No worrisome lesions seen.   No results found for this or any previous visit (from the past 72 hour(s)).

## 2011-07-27 NOTE — Assessment & Plan Note (Signed)
Patient has mild shortness of breath associated with wheezing on exertion.  Her pulmonary exam today is nonspecific. Additionally she had coughing after multiple deep breaths. She does have a smoking history.  I'm concerned that she may have mild COPD, versus reactive airway disease.   Additionally on the differential his cardiac disease. However she doesn't have any JVD or edema chest pain or palpitations associated with this exertional shortness of breath. Plan to assess workup with spirometry with Dr. Raymondo Band.   Patient expresses understanding and will followup with Dr. Sheffield Slider

## 2011-07-27 NOTE — Assessment & Plan Note (Signed)
Patient has osteoarthritis.  She currently manages this with ibuprofen. Tramadol helps and patient would like to have tramadol and.  I feel this is reasonable.  Plan to prescribe tramadol for use at night as needed with refills

## 2011-07-27 NOTE — Assessment & Plan Note (Signed)
Unchanged

## 2011-07-27 NOTE — Patient Instructions (Signed)
Thank you for coming in today. We will test your breathing in our clinic.  Please make an appointment for the Pharmacy Clinic with Dr. Raymondo Band for a breathing test.  Please follow up with Dr. Sheffield Slider as needed.  Take the tramadol as needed at night.  We will do some lab work today.  Call or go to the emergency room if you get worse, have trouble breathing, have chest pains, or palpitations.

## 2011-07-31 ENCOUNTER — Telehealth: Payer: Self-pay | Admitting: Family Medicine

## 2011-07-31 ENCOUNTER — Encounter: Payer: Self-pay | Admitting: Family Medicine

## 2011-07-31 NOTE — Telephone Encounter (Signed)
Message copied by Rodolph Bong on Mon Jul 31, 2011  8:11 AM ------      Message from: Barbaraann Barthel      Created: Fri Jul 28, 2011  4:22 PM                   ----- Message -----         From: [facility] Interface         Sent: 07/27/2011   6:24 PM           To: Barbaraann Barthel, MD

## 2011-08-08 ENCOUNTER — Ambulatory Visit (INDEPENDENT_AMBULATORY_CARE_PROVIDER_SITE_OTHER): Payer: Medicare Other | Admitting: Pharmacist

## 2011-08-08 ENCOUNTER — Encounter: Payer: Self-pay | Admitting: Pharmacist

## 2011-08-08 VITALS — BP 134/73 | HR 82 | Ht 66.0 in | Wt 173.4 lb

## 2011-08-08 DIAGNOSIS — R0602 Shortness of breath: Secondary | ICD-10-CM

## 2011-08-08 DIAGNOSIS — J449 Chronic obstructive pulmonary disease, unspecified: Secondary | ICD-10-CM

## 2011-08-08 MED ORDER — FLUTICASONE-SALMETEROL 500-50 MCG/DOSE IN AEPB: 1.0000 | INHALATION_SPRAY | Freq: Two times a day (BID) | RESPIRATORY_TRACT | Status: DC

## 2011-08-08 NOTE — Progress Notes (Signed)
  Subjective:    Patient ID: Sara Stephenson, female    DOB: 04-13-1932, 78 y.o.   MRN: [id number]  HPI Patient arrives in good spirits for evaluation of lung function.   Patient reports smoking in the past from age 5 (46) until mid 35s.  Total years of smoking estimated to be 35 years.    She denies interest in returning to smoking.   She continues mow grass AND maintain the plant material in her 1 acre yard.   She notes that using the wheelbarrow makes it hard for her to catch her breath.   She notes exertion with exercise including walking fast.    She reports bronchitis on a nearly annual basis in February.     Review of Systems     Objective:   Physical Exam See Documentation Flowsheet (discrete results - PFTs) for complete Spirometry results. Patient provided good effort while attempting spirometry.   Albuterol Neb  Lot# [id number]   Exp. 01/2013        Assessment & Plan:  Spirometry evaluation reveals Severe obstructive lung disease with significant reversal in FVC and FEV1 on spirometry.  Patient has been experiencing shortness of breath with exertion for several months and taking no additional medications for this condition. Reviewed results of pulmonary function tests. Pt verbalized understanding of results. Initiated Advair 500/50 one inhalation twice daily.  Reviewed purpose, proper use and potential adverse effects.   Written pt instructions provided.  F/U Clinic visit with Dr. Sheffield Slider in 3-4 weeks.   Total time in face to face counseling 90 minutes.  Patient seen with Melynda Ripple, PharmD. Candidate.

## 2011-08-08 NOTE — Assessment & Plan Note (Signed)
Spirometry evaluation reveals Severe obstructive lung disease with significant reversal in FVC and FEV1 on spirometry.  Patient has been experiencing shortness of breath with exertion for several months and taking no additional medications for this condition. Reviewed results of pulmonary function tests. Pt verbalized understanding of results. Initiated Advair 500/50 one inhalation twice daily.  Reviewed purpose, proper use and potential adverse effects.   Written pt instructions provided.  F/U Clinic visit with Dr. Sheffield Slider in 3-4 weeks.   Total time in face to face counseling 90 minutes.  Patient seen with Melynda Ripple, PharmD. Candidate.

## 2011-08-08 NOTE — Progress Notes (Signed)
Patient ID: Sara Stephenson, female   DOB: [birth year], 78 y.o.   MRN: [id number] Reviewed and agree with Dr. Macky Lower management.

## 2011-08-08 NOTE — Patient Instructions (Signed)
Lung function test today showed severe obstruction with improvement after albuterol treatment.  Start ADVAIR one inhalation twice daily.  Rinse your mouth after use.  Next visit with Dr. Sheffield Slider in 3-4 weeks.

## 2011-08-29 ENCOUNTER — Encounter: Payer: Self-pay | Admitting: Family Medicine

## 2011-08-29 ENCOUNTER — Ambulatory Visit (INDEPENDENT_AMBULATORY_CARE_PROVIDER_SITE_OTHER): Payer: Medicare Other | Admitting: Family Medicine

## 2011-08-29 VITALS — BP 151/76 | HR 67 | Ht 64.5 in | Wt 175.0 lb

## 2011-08-29 DIAGNOSIS — J4489 Other specified chronic obstructive pulmonary disease: Secondary | ICD-10-CM

## 2011-08-29 DIAGNOSIS — Z6825 Body mass index (BMI) 25.0-25.9, adult: Secondary | ICD-10-CM

## 2011-08-29 DIAGNOSIS — J449 Chronic obstructive pulmonary disease, unspecified: Secondary | ICD-10-CM

## 2011-08-29 DIAGNOSIS — E663 Overweight: Secondary | ICD-10-CM

## 2011-08-29 DIAGNOSIS — M1990 Unspecified osteoarthritis, unspecified site: Secondary | ICD-10-CM

## 2011-08-29 NOTE — Patient Instructions (Signed)
Please return to see Dr Sheffield Slider in 2 months.  Start walking 5 days weekly for 10 minutes and gradually build up to 20 minutes.   I'm glad that you are responding well to the medication.

## 2011-08-29 NOTE — Assessment & Plan Note (Signed)
Much improved on Advair. Offered to reduce dose, but she is reluctant to change yet. She's to call if she needs rescue medicine

## 2011-08-29 NOTE — Progress Notes (Signed)
  Subjective:    Patient ID: Sara Stephenson, female    DOB: Jan 02, 1933, 78 y.o.   MRN: [id number]  HPI Emphysema - She has felt much better on the Advair. Read on the internet that the lower dose is usually used. Has had much less dyspnea on exertion and rare wheezing. Doesn't feel the need for a rescue inhaler. OA - feels better able to exercise and likes to walk, but is limited by degenerative joint disease in her ankle. In the AM it is hard to take the first steps, so when she awakens in the early morning she takes 3 Advil and returns to sleep. No epigastric symptoms    Review of Systems     Objective:   Physical Exam  Cardiovascular: Normal rate and regular rhythm.   Pulmonary/Chest: Effort normal and breath sounds normal. No respiratory distress. She has no wheezes. She has no rales. She exhibits no tenderness.  Musculoskeletal: She exhibits no edema.  Psychiatric: She has a normal mood and affect.          Assessment & Plan:

## 2011-08-29 NOTE — Assessment & Plan Note (Signed)
Recently worst in ankles

## 2011-08-29 NOTE — Assessment & Plan Note (Signed)
Remains just below obese range. Non-weight bearing exercise recommended while she is slowly increasing walking time. Reduce calories to lose weight.

## 2011-11-02 ENCOUNTER — Telehealth: Payer: Self-pay | Admitting: Family Medicine

## 2011-11-02 NOTE — Telephone Encounter (Signed)
Pt states that she is having a bad reaction with the advair - makes her bones ache/ tremors / spasms and doesn't want to take it anymore.  Will discuss something different at her appt on 9/10

## 2011-11-02 NOTE — Telephone Encounter (Signed)
Fwd. To Dr.Hale for info. .Hena Ewalt  

## 2011-11-28 ENCOUNTER — Ambulatory Visit (INDEPENDENT_AMBULATORY_CARE_PROVIDER_SITE_OTHER): Payer: Medicare Other | Admitting: Family Medicine

## 2011-11-28 VITALS — BP 149/83 | HR 68 | Temp 98.1°F | Ht 64.5 in | Wt 173.0 lb

## 2011-11-28 DIAGNOSIS — J449 Chronic obstructive pulmonary disease, unspecified: Secondary | ICD-10-CM

## 2011-11-28 DIAGNOSIS — E663 Overweight: Secondary | ICD-10-CM

## 2011-11-28 DIAGNOSIS — Z6825 Body mass index (BMI) 25.0-25.9, adult: Secondary | ICD-10-CM

## 2011-11-28 DIAGNOSIS — M1990 Unspecified osteoarthritis, unspecified site: Secondary | ICD-10-CM

## 2011-11-28 MED ORDER — TIOTROPIUM BROMIDE MONOHYDRATE 18 MCG IN CAPS: 18.0000 ug | ORAL_CAPSULE | Freq: Every day | RESPIRATORY_TRACT | Status: DC

## 2011-11-28 MED ORDER — TRAMADOL HCL 50 MG PO TABS: 50.0000 mg | ORAL_TABLET | Freq: Every evening | ORAL | Status: DC | PRN

## 2011-11-28 NOTE — Patient Instructions (Addendum)
Please return to see Dr Sheffield Slider in 2 months.  Take the Acetaminophen 650 mg every 6 hours. Take a Tramadol 50 mg with it if needed.   If you have a flare of arthritis, Take Ibuprofen with them for a few days.  Replace the Advair with Spiriva

## 2011-11-29 NOTE — Progress Notes (Signed)
  Subjective:    Patient ID: Sara Stephenson, female    DOB: 11-30-1932, 78 y.o.   MRN: [id number]  HPI COPD - She stopped taking the Advair over 2 weeks ago du to tremor, aches and cramps in her hands that disappeared on stopping it. Her dyspnea and wheezing haven't recurred, but she hasn't been working outside or exerting herself much. Hasn't tried spiriva.   Arthralgias - pains in her ankles, right hip and right knee. For this she takes 2 Advil hs. She also intermittently takes Tramadol and Acetaminophen, but is afraid to take too much.   Insomnia - awakens at 2 AM and pain makes it hard to return to sleep   Review of Systems     Objective:   Physical Exam  Constitutional: She appears well-developed and well-nourished.  HENT:       Decreased hearing  Cardiovascular: Normal rate and regular rhythm.   No murmur heard. Pulmonary/Chest: Effort normal and breath sounds normal. No respiratory distress. She has no wheezes. She has no rales.  Musculoskeletal: She exhibits no edema.       Full range of motion of hips, knees and ankles  Knee joints mildly enlarged without effusion. No distinct joint line or infrapatellar tenderness. No gross changes in ankles.   Psychiatric: She has a normal mood and affect. Her behavior is normal. Judgment normal.          Assessment & Plan:

## 2011-11-29 NOTE — Assessment & Plan Note (Signed)
Doing OK off Advair but at risk with weather changes and viral infections. She would like to try Spiriva rather than a lower dose of Advair.

## 2011-11-29 NOTE — Assessment & Plan Note (Signed)
unchanged

## 2011-11-29 NOTE — Assessment & Plan Note (Signed)
Not taking adequate doses of Acetaminophen. Max dose 3000 mg since no indication of liver problem. Take with Tramadol when needed. Short courses of Advil when needed. If joint pains worsen, call and I will order xrays.

## 2011-12-08 ENCOUNTER — Telehealth: Payer: Self-pay | Admitting: *Deleted

## 2011-12-08 ENCOUNTER — Telehealth: Payer: Self-pay | Admitting: Family Medicine

## 2011-12-08 DIAGNOSIS — IMO0002 Reserved for concepts with insufficient information to code with codable children: Secondary | ICD-10-CM

## 2011-12-08 NOTE — Telephone Encounter (Signed)
Pt calling to let Dr. Sheffield Slider know she is ready to set up xrays, her rt hip is getting worse,  She is available any day but Friday.

## 2011-12-08 NOTE — Telephone Encounter (Signed)
Will fwd to Dr.Hale for order. Sara Stephenson, Sara Stephenson

## 2011-12-08 NOTE — Telephone Encounter (Signed)
Called pt. Order for x-ray in chart. She can go to [facility] or [facility]. Pt agreed. Lorenda Hatchet, Renato Battles

## 2011-12-13 ENCOUNTER — Ambulatory Visit
Admission: RE | Admit: 2011-12-13 | Discharge: 2011-12-13 | Disposition: A | Discharge status: Home / Self Care | Payer: Medicare Other | Source: Ambulatory Visit | Attending: Family Medicine | Admitting: Family Medicine

## 2011-12-13 DIAGNOSIS — IMO0002 Reserved for concepts with insufficient information to code with codable children: Secondary | ICD-10-CM

## 2011-12-15 ENCOUNTER — Telehealth: Payer: Self-pay | Admitting: Family Medicine

## 2011-12-15 NOTE — Telephone Encounter (Signed)
I called to give her the hip xray results which showed osteopenia, but minimal DJD changes. After taking a course of Ibuprofen the hip is better and she's back to the acetominophen and tramadol which also helps her ankles. She is taking the bid Oscal. Doesn't want to take Fosamax which irritated her mother's esophagus. She will defer a BMD test and the knee xrays.

## 2012-02-14 ENCOUNTER — Telehealth: Payer: Self-pay | Admitting: Family Medicine

## 2012-02-14 ENCOUNTER — Other Ambulatory Visit: Payer: Self-pay | Admitting: Family Medicine

## 2012-02-14 DIAGNOSIS — M1990 Unspecified osteoarthritis, unspecified site: Secondary | ICD-10-CM

## 2012-02-14 MED ORDER — TRAMADOL HCL 50 MG PO TABS: 50.0000 mg | ORAL_TABLET | Freq: Three times a day (TID) | ORAL | Status: DC | PRN

## 2012-02-14 NOTE — Telephone Encounter (Signed)
Patient needs the Rx for Tramadol to reflect the change from 1 per day to 2 per day.  The way it is written now, she runs out early because the quantity only last 1/2 of the time.  She has 1 pill left for tonight and needs this refilled today.

## 2012-02-14 NOTE — Telephone Encounter (Signed)
Called pt. She reports, that Dr.Hale advised her at her last OV to d/c Advil and she can take Tramadol with Tylenol up to 4 x daily prn. (I do not see it in chart) pt request to have instructions changed on Tramadol please. Will fwd. To Dr.Hale for review. Lorenda Hatchet, Renato Battles

## 2012-04-04 ENCOUNTER — Other Ambulatory Visit: Payer: Self-pay | Admitting: Family Medicine

## 2012-04-04 DIAGNOSIS — Z1231 Encounter for screening mammogram for malignant neoplasm of breast: Secondary | ICD-10-CM

## 2012-04-18 ENCOUNTER — Ambulatory Visit (INDEPENDENT_AMBULATORY_CARE_PROVIDER_SITE_OTHER): Payer: Medicare Other | Admitting: Family Medicine

## 2012-04-18 ENCOUNTER — Encounter: Payer: Self-pay | Admitting: Family Medicine

## 2012-04-18 VITALS — BP 164/70 | HR 81 | Temp 97.9°F | Ht 64.5 in | Wt 170.0 lb

## 2012-04-18 DIAGNOSIS — R030 Elevated blood-pressure reading, without diagnosis of hypertension: Secondary | ICD-10-CM

## 2012-04-18 DIAGNOSIS — J449 Chronic obstructive pulmonary disease, unspecified: Secondary | ICD-10-CM

## 2012-04-18 DIAGNOSIS — M6740 Ganglion, unspecified site: Secondary | ICD-10-CM

## 2012-04-18 DIAGNOSIS — M479 Spondylosis, unspecified: Secondary | ICD-10-CM

## 2012-04-18 MED ORDER — ALBUTEROL SULFATE HFA 108 (90 BASE) MCG/ACT IN AERS: 2.0000 | INHALATION_SPRAY | Freq: Four times a day (QID) | RESPIRATORY_TRACT | Status: AC | PRN

## 2012-04-18 MED ORDER — SCOPOLAMINE 1 MG/3DAYS TD PT72: 1.0000 | MEDICATED_PATCH | TRANSDERMAL | Status: DC

## 2012-04-18 NOTE — Assessment & Plan Note (Signed)
Continue to monitor at home. Goal is always less than 150/90

## 2012-04-18 NOTE — Progress Notes (Signed)
  Subjective:    Patient ID: Sara Stephenson, female    DOB: 08-09-1932, 80 y.o.   MRN: [id number]  HPI COPD - she gets dyspnea on exertion when [place] in an airport with some wheezing, but no shortness of breath at rest or at nighttime nor wheezing or frequent cough. Rare cough and wheeze in cold air. No recent respiratory infections. She couldn't tolerate Spiriva due to nasal congestion and numbness of her upper lip.   Hip pain - She was diagnosed with trochanteric bursitis by a PA at the [facility] orthopedic clinic and has responded completely to an injection there.   Motion sickness - she's going on a cruise with her daughter and did well previously with a scopolamine patch.   blood pressure - was 130/80 before a root canal last week and is in the normal range at home.  Review of Systems     Objective:   Physical Exam  Cardiovascular: Normal rate and regular rhythm.   Pulmonary/Chest: Effort normal and breath sounds normal. No respiratory distress. She has no wheezes. She has no rales.       Distant breath sounds   Neurological: She is alert.  Psychiatric: She has a normal mood and affect. Her behavior is normal. Judgment and thought content normal.          Assessment & Plan:

## 2012-04-18 NOTE — Assessment & Plan Note (Signed)
She just wants rescue medicine now so albuterol prescribed. I mentioned that she should have a spacer if she isn't responding well to inhalations.

## 2012-04-18 NOTE — Patient Instructions (Addendum)
Consider transfer to [facility]  [address]  Or Dick GilbertChronic Obstructive Pulmonary Disease Chronic obstructive pulmonary disease (COPD) is a lung disease. The lungs become damaged, making it hard to get air in and out of your lungs. The damage to your lungs cannot be changed.  HOME CARE  Stop smoking if you smoke. Avoid secondhand smoke.  Only take medicine as told by your doctor.  Talk to your doctor about using cough syrup or over-the-counter medicines.  Drink enough fluids to keep your pee (urine) clear or pale yellow.  Use a humidifier or vaporizer. This may help loosen the thick spit (mucus).  Talk to your doctor about vaccines that help prevent other lung problems (pneumonia and flu vaccines).  Use home oxygen as told by your doctor.  Stay active and exercise.  Eat healthy foods. GET HELP RIGHT AWAY IF:   Your heart is beating fast.  You become disturbed, confused, shake, or are dazed.  You have trouble breathing.  You have chest pain.  You have a fever.  You cough up thick spit that is yellowish-white or green.  Your breathing becomes worse when you exercise.  You are running out of the medicine you take for your breathing. MAKE SURE YOU:   Understand these instructions.  Will watch your condition.  Will get help right away if you are not doing well or get worse. Document Released: 08/23/2007 Document Revised: 05/29/2011 Document Reviewed: 05/06/2010 [facility] Patient Information 2013 [place], [place].  Hip Exercises RANGE OF MOTION (ROM) AND STRETCHING EXERCISES  These exercises may help you when beginning to rehabilitate your injury. Doing them too aggressively can worsen your condition. Complete them slowly and gently. Your symptoms may resolve with or without further involvement from your physician, physical therapist or athletic trainer. While completing these exercises, remember:   Restoring tissue  flexibility helps normal motion to return to the joints. This allows healthier, less painful movement and activity.  An effective stretch should be held for at least 30 seconds.  A stretch should never be painful. You should only feel a gentle lengthening or release in the stretched tissue. If these stretches worsen your symptoms even when done gently, consult your physician, physical therapist or athletic trainer. STRETCH Hamstrings, Supine   Lie on your back. Loop a belt or towel over the ball of your right / left foot.  Straighten your right / left knee and slowly pull on the belt to raise your leg. Do not allow the right / left knee to bend. Keep your opposite leg flat on the floor.  Raise the leg until you feel a gentle stretch behind your right / left knee or thigh. Hold this position for __________ seconds. Repeat __________ times. Complete this stretch __________ times per day.  STRETCH - Hip Rotators   Lie on your back on a firm surface. Grasp your right / left knee with your right / left hand and your ankle with your opposite hand.  Keeping your hips and shoulders firmly planted, gently pull your right / left knee and rotate your lower leg toward your opposite shoulder until you feel a stretch in your buttocks.  Hold this stretch for __________ seconds. Repeat this stretch __________ times. Complete this stretch __________ times per day. STRETCH - Hamstrings/Adductors, V-Sit   Sit on the floor with your legs extended in a large "V," keeping your knees straight.  With your head and chest upright, bend at your waist reaching for your  right foot to stretch your left adductors.  You should feel a stretch in your left inner thigh. Hold for __________ seconds.  Return to the upright position to relax your leg muscles.  Continuing to keep your chest upright, bend straight forward at your waist to stretch your hamstrings.  You should feel a stretch behind both of your thighs and/or  knees. Hold for __________ seconds.  Return to the upright position to relax your leg muscles.  Repeat steps 2 through 4 for opposite leg. Repeat __________ times. Complete this exercise __________ times per day.  STRETCHING - Hip Flexors, Lunge  Half kneel with your right / left knee on the floor and your opposite knee bent and directly over your ankle.  Keep good posture with your head over your shoulders. Tighten your buttocks to point your tailbone downward; this will prevent your back from arching too much.  You should feel a gentle stretch in the front of your thigh and/or hip. If you do not feel any resistance, slightly slide your opposite foot forward and then slowly lunge forward so your knee once again lines up over your ankle. Be sure your tailbone remains pointed downward.  Hold this stretch for __________ seconds. Repeat __________ times. Complete this stretch __________ times per day. STRENGTHENING EXERCISES These exercises may help you when beginning to rehabilitate your injury. They may resolve your symptoms with or without further involvement from your physician, physical therapist or athletic trainer. While completing these exercises, remember:   Muscles can gain both the endurance and the strength needed for everyday activities through controlled exercises.  Complete these exercises as instructed by your physician, physical therapist or athletic trainer. Progress the resistance and repetitions only as guided.  You may experience muscle soreness or fatigue, but the pain or discomfort you are trying to eliminate should never worsen during these exercises. If this pain does worsen, stop and make certain you are following the directions exactly. If the pain is still present after adjustments, discontinue the exercise until you can discuss the trouble with your clinician. STRENGTH - Hip Extensors, Bridge   Lie on your back on a firm surface. Bend your knees and place your feet  flat on the floor.  Tighten your buttocks muscles and lift your bottom off the floor until your trunk is level with your thighs. You should feel the muscles in your buttocks and back of your thighs working. If you do not feel these muscles, slide your feet 1-2 inches further away from your buttocks.  Hold this position for __________ seconds.  Slowly lower your hips to the starting position and allow your buttock muscles relax completely before beginning the next repetition.  If this exercise is too easy, you may cross your arms over your chest. Repeat __________ times. Complete this exercise __________ times per day.  STRENGTH - Hip Abductors, Straight Leg Raises  Be aware of your form throughout the entire exercise so that you exercise the correct muscles. Sloppy form means that you are not strengthening the correct muscles.  Lie on your side so that your head, shoulders, knee and hip line up. You may bend your lower knee to help maintain your balance. Your right / left leg should be on top.  Roll your hips slightly forward, so that your hips are stacked directly over each other and your right / left knee is facing forward.  Lift your top leg up 4-6 inches, leading with your heel. Be sure that your foot does  not drift forward or that your knee does not roll toward the ceiling.  Hold this position for __________ seconds. You should feel the muscles in your outer hip lifting (you may not notice this until your leg begins to tire).  Slowly lower your leg to the starting position. Allow the muscles to fully relax before beginning the next repetition. Repeat __________ times. Complete this exercise __________ times per day.  STRENGTH - Hip Adductors, Straight Leg Raises   Lie on your side so that your head, shoulders, knee and hip line up. You may place your upper foot in front to help maintain your balance. Your right / left leg should be on the bottom.  Roll your hips slightly forward, so  that your hips are stacked directly over each other and your right / left knee is facing forward.  Tense the muscles in your inner thigh and lift your bottom leg 4-6 inches. Hold this position for __________ seconds.  Slowly lower your leg to the starting position. Allow the muscles to fully relax before beginning the next repetition. Repeat __________ times. Complete this exercise __________ times per day.  STRENGTH - Quadriceps, Straight Leg Raises  Quality counts! Watch for signs that the quadriceps muscle is working to insure you are strengthening the correct muscles and not "cheating" by substituting with healthier muscles.  Lay on your back with your right / left leg extended and your opposite knee bent.  Tense the muscles in the front of your right / left thigh. You should see either your knee cap slide up or increased dimpling just above the knee. Your thigh may even quiver.  Tighten these muscles even more and raise your leg 4 to 6 inches off the floor. Hold for right / left seconds.  Keeping these muscles tense, lower your leg.  Relax the muscles slowly and completely in between each repetition. Repeat __________ times. Complete this exercise __________ times per day.  STRENGTH - Hip Abductors, Standing  Tie one end of a rubber exercise band/tubing to a secure surface (table, pole) and tie a loop at the other end.  Place the loop around your right / left ankle. Keeping your ankle with the band directly opposite of the secured end, step away until there is tension in the tube/band.  Hold onto a chair as needed for balance.  Keeping your back upright, your shoulders over your hips, and your toes pointing forward, lift your right / left leg out to your side. Be sure to lift your leg with your hip muscles. Do not "throw" your leg or tip your body to lift your leg.  Slowly and with control, return to the starting position. Repeat exercise __________ times. Complete this exercise  __________ times per day.  STRENGTH  Quadriceps, Squats  Stand in a door frame so that your feet and knees are in line with the frame.  Use your hands for balance, not support, on the frame.  Slowly lower your weight, bending at the hips and knees. Keep your lower legs upright so that they are parallel with the door frame. Squat only within the range that does not increase your knee pain. Never let your hips drop below your knees.  Slowly return upright, pushing with your legs, not pulling with your hands. Document Released: 03/24/2005 Document Revised: 05/29/2011 Document Reviewed: 06/18/2008 [facility] Patient Information 2013 [place], [place].  Check your blood pressure at home. It should usually be below 150/90  I recommend a follow-up visit for your  breathing in 6 month.

## 2012-04-18 NOTE — Assessment & Plan Note (Signed)
Not painful recently

## 2012-04-18 NOTE — Assessment & Plan Note (Signed)
Present, but not symptomatic

## 2012-05-06 ENCOUNTER — Ambulatory Visit
Admission: RE | Admit: 2012-05-06 | Discharge: 2012-05-06 | Disposition: A | Discharge status: Home / Self Care | Payer: Medicare Other | Source: Ambulatory Visit | Attending: Family Medicine | Admitting: Family Medicine

## 2012-05-06 DIAGNOSIS — Z1231 Encounter for screening mammogram for malignant neoplasm of breast: Secondary | ICD-10-CM

## 2012-12-07 LAB — CBC
HCT: 42 % (ref 35.0–47.0)
HGB: 14.4 g/dL (ref 12.0–16.0)
MCH: 30.9 pg (ref 26.0–34.0)
MCV: 90 fL (ref 80–100)
RBC: 4.67 10*6/uL (ref 3.80–5.20)

## 2012-12-08 ENCOUNTER — Observation Stay: Payer: Self-pay | Admitting: Surgery

## 2012-12-08 LAB — URINALYSIS, COMPLETE
Blood: NEGATIVE
Nitrite: NEGATIVE
Ph: 8 (ref 4.5–8.0)
Protein: NEGATIVE
RBC,UR: <1 /HPF (ref 0–5)
Squamous Epithelial: NONE SEEN

## 2012-12-08 LAB — COMPREHENSIVE METABOLIC PANEL
Alkaline Phosphatase: 63 U/L (ref 50–136)
Anion Gap: 6 — ABNORMAL LOW (ref 7–16)
Bilirubin,Total: 0.5 mg/dL (ref 0.2–1.0)
Creatinine: 0.83 mg/dL (ref 0.60–1.30)
EGFR (African American): >60
Osmolality: 284 (ref 275–301)
SGOT(AST): 20 U/L (ref 15–37)
SGPT (ALT): 20 U/L (ref 12–78)
Total Protein: 6.4 g/dL (ref 6.4–8.2)

## 2013-01-30 ENCOUNTER — Ambulatory Visit: Payer: Self-pay | Admitting: Internal Medicine

## 2013-04-29 ENCOUNTER — Ambulatory Visit: Payer: Self-pay | Admitting: Specialist

## 2013-06-24 ENCOUNTER — Other Ambulatory Visit: Payer: Self-pay

## 2013-06-24 DIAGNOSIS — Z1231 Encounter for screening mammogram for malignant neoplasm of breast: Secondary | ICD-10-CM

## 2013-07-14 ENCOUNTER — Ambulatory Visit
Admission: RE | Admit: 2013-07-14 | Discharge: 2013-07-14 | Disposition: A | Discharge status: Home / Self Care | Payer: Medicare Other | Source: Ambulatory Visit

## 2013-07-14 ENCOUNTER — Encounter (INDEPENDENT_AMBULATORY_CARE_PROVIDER_SITE_OTHER): Payer: Self-pay

## 2013-07-14 DIAGNOSIS — Z1231 Encounter for screening mammogram for malignant neoplasm of breast: Secondary | ICD-10-CM

## 2014-07-10 NOTE — H&P (Signed)
   Subjective/Chief Complaint Abdominal pain, anorexia   History of Present Illness Sara Stephenson is a 81 yo F with a history of mild COPD who presents with 1 day of periumbilical pain progressing to RLQ pain.  She says that she was riding on a tour bus from [place] when she began hurting.  It got worse during the day and moved to her RLQ. Also was associated with some burning with urination and a headache.  No nausea/vomiting but without appetite.  Last PO was around lunch time.  Headache has improved some.   Past History COPD   Past Med/Surgical Hx:  COPD:   ALLERGIES:  Sulfa drugs: Headaches  Codeine: Rash  Family and Social History:  Family History Diabetes Mellitus  Cancer   Social History negative tobacco, negative ETOH, negative Illicit drugs   Place of Living Home  From [place], here with granddaughter   Review of Systems:  Subjective/Chief Complaint RLQ pain, anorexia, dysuria   Fever/Chills Yes   Cough No   Sputum No   Abdominal Pain Yes   Diarrhea No   Constipation No   Nausea/Vomiting No   SOB/DOE No   Chest Pain No   Dysuria Yes   Tolerating PT No   Tolerating Diet No   Physical Exam:  GEN well developed, well nourished, no acute distress   HEENT pink conjunctivae, PERRL, hearing intact to voice, moist oral mucosa, good dentition   RESP normal resp effort  clear BS  no use of accessory muscles   CARD regular rate  no murmur  no thrills  no carotid bruits  No LE edema  no JVD  no Rub   ABD positive tenderness  no liver/spleen enlargement  no hernia  soft  normal BS  no Abdominal Bruits  no Adominal Mass  + very tender to palpation RLQ   EXTR negative cyanosis/clubbing, negative edema   SKIN normal to palpation, No rashes, No ulcers, skin turgor good   NEURO cranial nerves intact, negative rigidity, negative tremor, follows commands, strength:, motor/sensory function intact   PSYCH A+O to time, place, person, good insight     Assessment/Admission Diagnosis Sara Stephenson is a pleasant 18 yo F who presents with RLQ pain, leukocytosis, dilated appendix with periappendiceal stranding.  Clinical and radiographic appendicitis.   Plan OR for laparoscopic appendectomy   Electronic Signatures: Floyde Parkins (MD)  (Signed 21-Sep-14 04:15)  Authored: CHIEF COMPLAINT and HISTORY, PAST MEDICAL/SURGIAL HISTORY, ALLERGIES, FAMILY AND SOCIAL HISTORY, REVIEW OF SYSTEMS, PHYSICAL EXAM, ASSESSMENT AND PLAN   Last Updated: 21-Sep-14 04:15 by Floyde Parkins (MD)

## 2014-07-10 NOTE — Op Note (Signed)
PATIENT NAME:  Sara Stephenson, Sara Stephenson MR#:  [id number] DATE OF BIRTH:  1932/10/25  DATE OF PROCEDURE:  12/08/2012  PREOPERATIVE DIAGNOSIS: Acute appendicitis.   POSTOPERATIVE DIAGNOSIS: Acute appendicitis, gangrenous, but nonsuppurative.   OPERATION PERFORMED: Laparoscopic appendectomy.   SURGEON: Consuela Mimes, M.D.   ANESTHESIA: General.   PROCEDURE IN DETAIL: The patient was placed supine on the operating room table and prepped and draped in the usual sterile fashion. A Hassan cannula was introduced, horizontal mattress sutures of 0 Vicryl in the supraumbilical midline and a 15 mmHg CO2 pneumoperitoneum was created. Two additional 5 mm trocars were placed under direct visualization. The patient was noted to have adhesions to the ascending colon and the cecum and the anterior abdominal wall and some of these were taken down with the Harmonic scalpel. The appendix itself was gangrenous but there was no fibrinous exudate and there was no pus. The mesoappendix was taken down with the Harmonic scalpel. The appendectomy was performed with an Endo GIA stapling device just where the appendix met the cecum, and the appendix was placed in an Endo Catch bag and extracted from the abdomen via the epigastric port. The right lower quadrant and pelvis were irrigated with a small amount of warm normal saline and this was suctioned clear and the omentum was then dragged over top of the appendiceal stump and the peritoneum was desufflated and decannulated. The previously placed 0 Vicryls were tied one to another and a single 0 PDS was placed between them in the midline fascia. All three skin sites were closed with subcuticular 5-0 Monocryl and suture strips. The patient tolerated the procedure well. There were no complications.   ____________________________ Consuela Mimes, MD [id number] D: 12/08/2012 08:25:48 ET T: 12/08/2012 09:30:27 ET JOB#: [id number]  cc: Consuela Mimes, MD, <Dictator> Consuela Mimes  MD ELECTRONICALLY SIGNED 12/08/2012 11:08

## 2014-12-25 ENCOUNTER — Ambulatory Visit
Admission: RE | Admit: 2014-12-25 | Discharge: 2014-12-25 | Disposition: A | Discharge status: Home / Self Care | Payer: Medicare Other | Source: Ambulatory Visit | Attending: Internal Medicine | Admitting: Internal Medicine

## 2014-12-25 ENCOUNTER — Ambulatory Visit
Admission: RE | Admit: 2014-12-25 | Discharge: 2014-12-25 | Disposition: A | Discharge status: Home / Self Care | Payer: Medicare Other | Source: Intra-hospital | Attending: Internal Medicine | Admitting: Internal Medicine

## 2014-12-25 ENCOUNTER — Other Ambulatory Visit: Payer: Self-pay | Admitting: Internal Medicine

## 2014-12-25 DIAGNOSIS — M5136 Other intervertebral disc degeneration, lumbar region: Secondary | ICD-10-CM | POA: Insufficient documentation

## 2014-12-25 DIAGNOSIS — M5440 Lumbago with sciatica, unspecified side: Secondary | ICD-10-CM

## 2014-12-25 DIAGNOSIS — M545 Low back pain: Secondary | ICD-10-CM | POA: Insufficient documentation

## 2014-12-25 DIAGNOSIS — M5430 Sciatica, unspecified side: Secondary | ICD-10-CM | POA: Diagnosis present

## 2015-01-13 ENCOUNTER — Encounter: Payer: Self-pay | Admitting: Emergency Medicine

## 2015-01-13 ENCOUNTER — Emergency Department
Admission: EM | Admit: 2015-01-13 | Discharge: 2015-01-13 | Disposition: A | Discharge status: Home / Self Care | Payer: Medicare Other | Source: Home / Self Care | Attending: Emergency Medicine | Admitting: Emergency Medicine

## 2015-01-13 ENCOUNTER — Emergency Department: Payer: Medicare Other

## 2015-01-13 ENCOUNTER — Emergency Department
Admission: EM | Admit: 2015-01-13 | Discharge: 2015-01-13 | Disposition: A | Discharge status: Home / Self Care | Payer: Medicare Other | Attending: Emergency Medicine | Admitting: Emergency Medicine

## 2015-01-13 DIAGNOSIS — Z7982 Long term (current) use of aspirin: Secondary | ICD-10-CM | POA: Diagnosis not present

## 2015-01-13 DIAGNOSIS — N23 Unspecified renal colic: Secondary | ICD-10-CM | POA: Insufficient documentation

## 2015-01-13 DIAGNOSIS — Z7951 Long term (current) use of inhaled steroids: Secondary | ICD-10-CM | POA: Insufficient documentation

## 2015-01-13 DIAGNOSIS — Z87891 Personal history of nicotine dependence: Secondary | ICD-10-CM | POA: Insufficient documentation

## 2015-01-13 DIAGNOSIS — R1032 Left lower quadrant pain: Secondary | ICD-10-CM | POA: Diagnosis present

## 2015-01-13 DIAGNOSIS — Z79899 Other long term (current) drug therapy: Secondary | ICD-10-CM | POA: Diagnosis not present

## 2015-01-13 DIAGNOSIS — R109 Unspecified abdominal pain: Secondary | ICD-10-CM

## 2015-01-13 DIAGNOSIS — R110 Nausea: Secondary | ICD-10-CM

## 2015-01-13 DIAGNOSIS — N200 Calculus of kidney: Secondary | ICD-10-CM

## 2015-01-13 HISTORY — DX: Chronic obstructive pulmonary disease, unspecified: J44.9

## 2015-01-13 HISTORY — DX: Calculus of kidney: N20.0

## 2015-01-13 LAB — URINALYSIS COMPLETE WITH MICROSCOPIC (ARMC ONLY)
BACTERIA UA: NONE SEEN
BILIRUBIN URINE: NEGATIVE
BILIRUBIN URINE: NEGATIVE
GLUCOSE, UA: NEGATIVE mg/dL
GLUCOSE, UA: NEGATIVE mg/dL
HGB URINE DIPSTICK: NEGATIVE
Ketones, ur: NEGATIVE mg/dL
LEUKOCYTES UA: NEGATIVE
NITRITE: NEGATIVE
Nitrite: NEGATIVE
Protein, ur: NEGATIVE mg/dL
Protein, ur: NEGATIVE mg/dL
SPECIFIC GRAVITY, URINE: 1.016 (ref 1.005–1.030)
Specific Gravity, Urine: 1.02 (ref 1.005–1.030)
Squamous Epithelial / LPF: NONE SEEN
pH: 6 (ref 5.0–8.0)
pH: 6 (ref 5.0–8.0)

## 2015-01-13 LAB — CBC
HCT: 39.6 % (ref 35.0–47.0)
HEMATOCRIT: 38 % (ref 35.0–47.0)
HEMOGLOBIN: 12.6 g/dL (ref 12.0–16.0)
Hemoglobin: 13.6 g/dL (ref 12.0–16.0)
MCH: 31.3 pg (ref 26.0–34.0)
MCH: 30 pg (ref 26.0–34.0)
MCHC: 34.2 g/dL (ref 32.0–36.0)
MCHC: 33.3 g/dL (ref 32.0–36.0)
MCV: 91.5 fL (ref 80.0–100.0)
MCV: 90.1 fL (ref 80.0–100.0)
PLATELETS: 202 10*3/uL (ref 150–440)
Platelets: 196 10*3/uL (ref 150–440)
RBC: 4.33 MIL/uL (ref 3.80–5.20)
RBC: 4.21 MIL/uL (ref 3.80–5.20)
RDW: 13.8 % (ref 11.5–14.5)
RDW: 13.8 % (ref 11.5–14.5)
WBC: 9 10*3/uL (ref 3.6–11.0)
WBC: 9.9 10*3/uL (ref 3.6–11.0)

## 2015-01-13 LAB — BASIC METABOLIC PANEL
ANION GAP: 8 (ref 5–15)
BUN: 20 mg/dL (ref 6–20)
CALCIUM: 9.2 mg/dL (ref 8.9–10.3)
CO2: 25 mmol/L (ref 22–32)
Chloride: 108 mmol/L (ref 101–111)
Creatinine, Ser: 0.8 mg/dL (ref 0.44–1.00)
Glucose, Bld: 110 mg/dL — ABNORMAL HIGH (ref 65–99)
Potassium: 4 mmol/L (ref 3.5–5.1)
SODIUM: 141 mmol/L (ref 135–145)

## 2015-01-13 LAB — COMPREHENSIVE METABOLIC PANEL
ALK PHOS: 57 U/L (ref 38–126)
ALT: 16 U/L (ref 14–54)
AST: 26 U/L (ref 15–41)
Albumin: 4.3 g/dL (ref 3.5–5.0)
Anion gap: 9 (ref 5–15)
BUN: 24 mg/dL — ABNORMAL HIGH (ref 6–20)
CALCIUM: 9.4 mg/dL (ref 8.9–10.3)
CO2: 25 mmol/L (ref 22–32)
CREATININE: 0.87 mg/dL (ref 0.44–1.00)
Chloride: 110 mmol/L (ref 101–111)
Glucose, Bld: 136 mg/dL — ABNORMAL HIGH (ref 65–99)
Potassium: 3.7 mmol/L (ref 3.5–5.1)
Sodium: 144 mmol/L (ref 135–145)
Total Bilirubin: 0.3 mg/dL (ref 0.3–1.2)
Total Protein: 7 g/dL (ref 6.5–8.1)

## 2015-01-13 LAB — LIPASE, BLOOD: Lipase: 25 U/L (ref 11–51)

## 2015-01-13 MED ORDER — TAMSULOSIN HCL 0.4 MG PO CAPS: 0.4000 mg | ORAL_CAPSULE | Freq: Every day | ORAL | Status: DC

## 2015-01-13 MED ORDER — SODIUM CHLORIDE 0.9 % IV BOLUS (SEPSIS)
1000.0000 mL | Freq: Once | INTRAVENOUS | Status: AC
  Administered 2015-01-13: 1000 mL via INTRAVENOUS

## 2015-01-13 MED ORDER — ONDANSETRON HCL 4 MG/2ML IJ SOLN
4.0000 mg | Freq: Once | INTRAMUSCULAR | Status: AC
  Administered 2015-01-13: 4 mg via INTRAVENOUS
  Filled 2015-01-13: qty 2

## 2015-01-13 MED ORDER — OXYCODONE-ACETAMINOPHEN 5-325 MG PO TABS: 1.0000 | ORAL_TABLET | Freq: Four times a day (QID) | ORAL | Status: DC | PRN

## 2015-01-13 MED ORDER — ONDANSETRON 4 MG PO TBDP: 4.0000 mg | ORAL_TABLET | Freq: Three times a day (TID) | ORAL | Status: DC | PRN

## 2015-01-13 MED ORDER — FENTANYL CITRATE (PF) 100 MCG/2ML IJ SOLN
50.0000 ug | Freq: Once | INTRAMUSCULAR | Status: AC
  Administered 2015-01-13: 50 ug via INTRAVENOUS
  Filled 2015-01-13: qty 2

## 2015-01-13 MED ORDER — KETOROLAC TROMETHAMINE 10 MG PO TABS
10.0000 mg | ORAL_TABLET | Freq: Once | ORAL | Status: AC
  Administered 2015-01-13: 10 mg via ORAL
  Filled 2015-01-13: qty 1

## 2015-01-13 MED ORDER — MORPHINE SULFATE (PF) 2 MG/ML IV SOLN
2.0000 mg | Freq: Once | INTRAVENOUS | Status: AC
  Administered 2015-01-13: 2 mg via INTRAVENOUS
  Filled 2015-01-13: qty 1

## 2015-01-13 MED ORDER — OXYCODONE-ACETAMINOPHEN 5-325 MG PO TABS
1.0000 | ORAL_TABLET | Freq: Once | ORAL | Status: AC
  Administered 2015-01-13: 1 via ORAL
  Filled 2015-01-13: qty 1

## 2015-01-13 MED ORDER — KETOROLAC TROMETHAMINE 10 MG PO TABS: 10.0000 mg | ORAL_TABLET | Freq: Three times a day (TID) | ORAL | Status: DC | PRN

## 2015-01-13 NOTE — ED Notes (Signed)
CT called to notify this RN patient stated she felt like she was shaking, had similar reaction when she received pain medication after appendectomy 1+ years ago. Dr. Owens Shark aware. Patient received pain medication today one hour ago.

## 2015-01-13 NOTE — ED Notes (Signed)
Warm blanket given. No other needs at this time. Awaiting CT results and rest of lab results.

## 2015-01-13 NOTE — ED Provider Notes (Signed)
[facility] Emergency Department Provider Note  ____________________________________________  Time seen: 5:00 AM  I have reviewed the triage vital signs and the nursing notes.   HISTORY  Chief Complaint Flank Pain and Urinary Frequency      HPI Sara Stephenson is a 81 y.o. female presents with 10 out of 10 left flank pain with acute onset starting yesterday. Patient states that she had urinary urgency all of yesterday and then approximately one hour before presentation started having severe left flank pain. Patient denied any dysuria no hematuria. Patient denies any fever     Past Medical History  Diagnosis Date  . Rib fracture, right, with routine healing, subsequent encounter 05/2010    fall injury  . COPD (chronic obstructive pulmonary disease) [facility])     Patient Active Problem List   Diagnosis Date Noted  . Elevated blood pressure 04/18/2012  . COPD (chronic obstructive pulmonary disease) ([place]) 08/08/2011  . DEGENERATIVE JOINT DISEASE, CERVICAL SPINE 06/01/2009  . EPIDERMOID CYST 03/03/2008  . GANGLION CYST, WRIST, RIGHT 03/03/2008  . Overweight (BMI 25.0-29.9) 05/29/2006  . HYPERLIPIDEMIA 05/17/2006  . GASTROESOPHAGEAL REFLUX, NO ESOPHAGITIS 05/17/2006  . ACTINIC DERMATITIS 05/17/2006  . OSTEOARTHRITIS, MULTI SITES 05/17/2006  . OSTEOPENIA 05/17/2006    Past Surgical History  Procedure Laterality Date  . Tonsillectomy    . Breast biopsy  1954    left benign  . Retinal detachment surgery  1974    right  . Squamous cell carcinoma excision  2012    right bridge of nose  . Appendectomy      Current Outpatient Rx  Name  Route  Sig  Dispense  Refill  . Aclidinium Bromide (TUDORZA PRESSAIR) 400 MCG/ACT AEPB   Inhalation   Inhale 1 puff into the lungs 2 (two) times daily.         Marland Kitchen aspirin 81 MG tablet   Oral   Take 81 mg by mouth daily.         . Biotin (BIOTIN MAXIMUM STRENGTH) 10 MG TABS   Oral   Take 1 tablet by mouth  daily.         . Calcium Carbonate-Vitamin D (CALTRATE 600+D) 600-400 MG-UNIT per tablet   Oral   Take 1 tablet by mouth 2 (two) times daily.           . Cyanocobalamin (B-12) 500 MCG TABS   Oral   Take 500 mcg by mouth daily after breakfast.         . ibuprofen (ADVIL,MOTRIN) 200 MG tablet   Oral   Take 200 mg by mouth 2 (two) times daily.          . Acetaminophen (ACETAMINOPHEN 8 HOUR) 650 MG TABS   Oral   Take 1 tablet by mouth every 6 (six) hours as needed.         Marland Kitchen albuterol (PROAIR HFA) 108 (90 BASE) MCG/ACT inhaler   Inhalation   Inhale 2 puffs into the lungs every 6 (six) hours as needed for wheezing.   1 Inhaler   11   . scopolamine (TRANSDERM-SCOP) 1.5 MG   Transdermal   Place 1 patch (1.5 mg total) onto the skin every 3 (three) days.   4 patch   3   . traMADol (ULTRAM) 50 MG tablet   Oral   Take 1 tablet (50 mg total) by mouth every 8 (eight) hours as needed for pain.   90 tablet   11     Allergies  Tramadol; Sulfonamide derivatives; Hydrocodone; Levofloxacin; Pneumococcal vaccine polyvalent; Codeine; and Spiriva  Family History  Problem Relation Age of Onset  . Heart failure Maternal Grandmother   . Diabetes Maternal Grandmother     Social History Social History  Substance Use Topics  . Smoking status: Former Smoker -- 0.50 packs/day for 33 years    Types: Cigarettes  . Smokeless tobacco: None  . Alcohol Use: No    Review of Systems  Constitutional: Negative for fever. Eyes: Negative for visual changes. ENT: Negative for sore throat. Cardiovascular: Negative for chest pain. Respiratory: Negative for shortness of breath. Gastrointestinal: Negative for abdominal pain, vomiting and diarrhea. Genitourinary: Negative for dysuria. Positive for urinary urgency Musculoskeletal: Positive for left flank pain Skin: Negative for rash. Neurological: Negative for headaches, focal weakness or numbness.   10-point ROS otherwise  negative.  ____________________________________________   PHYSICAL EXAM:  VITAL SIGNS: ED Triage Vitals  Enc Vitals Group     BP 01/13/15 0450 152/62 mmHg     Pulse Rate 01/13/15 0450 72     Resp 01/13/15 0450 28     Temp 01/13/15 0450 98 F (36.7 C)     Temp Source 01/13/15 0450 Oral     SpO2 01/13/15 0450 100 %     Weight 01/13/15 0450 179 lb (81.194 kg)     Height 01/13/15 0450 5\' 4"  (1.626 m)     Head Cir --      Peak Flow --      Pain Score 01/13/15 0451 10     Pain Loc --      Pain Edu? --      Excl. in [place]? --      Constitutional: Alert and oriented. Apparent distress Eyes: Conjunctivae are normal. PERRL. Normal extraocular movements. ENT   Head: Normocephalic and atraumatic.   Nose: No congestion/rhinnorhea.   Mouth/Throat: Mucous membranes are moist.   Neck: No stridor. Hematological/Lymphatic/Immunilogical: No cervical lymphadenopathy. Cardiovascular: Normal rate, regular rhythm. Normal and symmetric distal pulses are present in all extremities. No murmurs, rubs, or gallops. Respiratory: Normal respiratory effort without tachypnea nor retractions. Breath sounds are clear and equal bilaterally. No wheezes/rales/rhonchi. Gastrointestinal: Soft and nontender. No distention. There is no CVA tenderness. Genitourinary: deferred Musculoskeletal: Nontender with normal range of motion in all extremities. No joint effusions.  No lower extremity tenderness nor edema. Neurologic:  Normal speech and language. No gross focal neurologic deficits are appreciated. Speech is normal.  Skin:  Skin is warm, dry and intact. No rash noted. Psychiatric: Mood and affect are normal. Speech and behavior are normal. Patient exhibits appropriate insight and judgment.  ____________________________________________    LABS (pertinent positives/negatives) Labs Reviewed  URINALYSIS COMPLETEWITH MICROSCOPIC ([place]) - Abnormal; Notable for the following:    Color, Urine  YELLOW (*)    APPearance HAZY (*)    Leukocytes, UA TRACE (*)    Bacteria, UA RARE (*)    Squamous Epithelial / LPF 0-5 (*)    All other components within normal limits  CBC  COMPREHENSIVE METABOLIC PANEL  LIPASE, BLOOD      RADIOLOGY       CT RENAL STONE STUDY (Final result) Result time: 01/13/15 06:32:32   Final result by Rad Results In Interface (01/13/15 06:32:32)   Narrative:   CLINICAL DATA: Urinary frequency beginning yesterday, new onset LEFT flank pain 1 hour ago. No history of kidney stones.  EXAM: CT ABDOMEN AND PELVIS WITHOUT CONTRAST  TECHNIQUE: Multidetector CT imaging of the abdomen and pelvis  was performed following the standard protocol without IV contrast.  COMPARISON: CT abdomen and pelvis December 08, 2012  FINDINGS: LUNG BASES: Lingular and RIGHT middle lobe atelectasis. Included heart size is normal, no pericardial effusions.  KIDNEYS/BLADDER: Kidneys are orthotopic, demonstrating normal size and morphology. Mild LEFT hydroureteronephrosis to the level of the ureterovesicular junction where a 2 mm calculus is present. No residual nephrolithiasis. Superimposed LEFT parapelvic cysts and 13 mm LEFT upper pole cyst better seen on prior contrast-enhanced CT. Limited assessment for renal masses on this nonenhanced examination. Urinary bladder is partially distended and unremarkable.  SOLID ORGANS: The liver, spleen, gallbladder, pancreas and adrenal glands are unremarkable for this non-contrast examination.  GASTROINTESTINAL TRACT: Moderate hiatal hernia. The stomach, small and large bowel are normal in course and caliber without inflammatory changes, the sensitivity may be decreased by lack of enteric contrast. Moderate transverse and descending colonic diverticulosis with colonic redundancy. Status post appendectomy.  PERITONEUM/RETROPERITONEUM: Aortoiliac vessels are normal in course and caliber, mild calcific atherosclerosis. No  lymphadenopathy by CT size criteria. Internal reproductive organs are unremarkable. No intraperitoneal free fluid nor free air. Phleboliths in the pelvis.  SOFT TISSUES/ OSSEOUS STRUCTURES: Nonsuspicious. Multilevel severe degenerative discs resulting in moderate LEFT L4-5 neural foraminal narrowing.  IMPRESSION: 2 mm LEFT ureterovesicular junction calculus resulting in mild obstructive uropathy. No residual nephrolithiasis.  Colonic diverticulosis without acute diverticulitis.   Electronically Signed By: Elon Alas M.D. On: 01/13/2015 06:32          INITIAL IMPRESSION / ASSESSMENT AND PLAN / ED COURSE  Pertinent labs & imaging results that were available during my care of the patient were reviewed by me and considered in my medical decision making (see chart for details).  History of physical exam concern for possible left ureterolithiasis as such CT scan of the abdomen performed which revealed a left 2 mm UVJ kidney stone.  ____________________________________________   FINAL CLINICAL IMPRESSION(S) / ED DIAGNOSES  Final diagnoses:  Left flank pain  Kidney stone on left side      Gregor Hams, MD 01/13/15 [phone]

## 2015-01-13 NOTE — ED Notes (Signed)
EMS states that pt woke up this morning with flank pain in left side. Pt was seen here and diagnosed with a kidney stone and discharged around 8 AM. Pt states they gave her a Toradol before she left but the pain has come back and she got no relief from the Toradol. Pt is complaining of pain to back, abdomen and left side.

## 2015-01-13 NOTE — ED Notes (Signed)
Pt presents to ED with urinary frequency and states she feels like she "needed to pee all the time" since yesterday. This evening approx 1 hour ago pt reports having a sudden onset of left sided flank pain. Pt restless and tearful during triage. Denies similar symptoms previously; no hx of kidney stones.

## 2015-01-13 NOTE — Discharge Instructions (Signed)
Please drink plenty of fluids to stay well hydrated. Please take Toradol for mild to moderate pain, with food. If you're taking Toradol, do not take other NSAID medications such as Advil, ibuprofen, or Aleve. You may take Tylenol to supplement if you are still having pain. Please take Percocet for severe pain. Do not drive within 8 hours of taking Percocet. Please take Flomax to help the kidney stone passed.  Please return to the emergency department if you develop pain that is not controlled with medications, inability to keep down fluids, fever, or any other symptoms concerning to you.  Kidney Stones Kidney stones (urolithiasis) are deposits that form inside your kidneys. The intense pain is caused by the stone moving through the urinary tract. When the stone moves, the ureter goes into spasm around the stone. The stone is usually passed in the urine.  CAUSES   A disorder that makes certain neck glands produce too much parathyroid hormone (primary hyperparathyroidism).  A buildup of uric acid crystals, similar to gout in your joints.  Narrowing (stricture) of the ureter.  A kidney obstruction present at birth (congenital obstruction).  Previous surgery on the kidney or ureters.  Numerous kidney infections. SYMPTOMS   Feeling sick to your stomach (nauseous).  Throwing up (vomiting).  Blood in the urine (hematuria).  Pain that usually spreads (radiates) to the groin.  Frequency or urgency of urination. DIAGNOSIS   Taking a history and physical exam.  Blood or urine tests.  CT scan.  Occasionally, an examination of the inside of the urinary bladder (cystoscopy) is performed. TREATMENT   Observation.  Increasing your fluid intake.  Extracorporeal shock wave lithotripsy--This is a noninvasive procedure that uses shock waves to break up kidney stones.  Surgery may be needed if you have severe pain or persistent obstruction. There are various surgical procedures. Most of the  procedures are performed with the use of small instruments. Only small incisions are needed to accommodate these instruments, so recovery time is minimized. The size, location, and chemical composition are all important variables that will determine the proper choice of action for you. Talk to your health care provider to better understand your situation so that you will minimize the risk of injury to yourself and your kidney.  HOME CARE INSTRUCTIONS   Drink enough water and fluids to keep your urine clear or pale yellow. This will help you to pass the stone or stone fragments.  Strain all urine through the provided strainer. Keep all particulate matter and stones for your health care provider to see. The stone causing the pain may be as small as a grain of salt. It is very important to use the strainer each and every time you pass your urine. The collection of your stone will allow your health care provider to analyze it and verify that a stone has actually passed. The stone analysis will often identify what you can do to reduce the incidence of recurrences.  Only take over-the-counter or prescription medicines for pain, discomfort, or fever as directed by your health care provider.  Keep all follow-up visits as told by your health care provider. This is important.  Get follow-up X-rays if required. The absence of pain does not always mean that the stone has passed. It may have only stopped moving. If the urine remains completely obstructed, it can cause loss of kidney function or even complete destruction of the kidney. It is your responsibility to make sure X-rays and follow-ups are completed. Ultrasounds of the  kidney can show blockages and the status of the kidney. Ultrasounds are not associated with any radiation and can be performed easily in a matter of minutes.  Make changes to your daily diet as told by your health care provider. You may be told to:  Limit the amount of salt that you  eat.  Eat 5 or more servings of fruits and vegetables each day.  Limit the amount of meat, poultry, fish, and eggs that you eat.  Collect a 24-hour urine sample as told by your health care provider.You may need to collect another urine sample every 6-12 months. SEEK MEDICAL CARE IF:  You experience pain that is progressive and unresponsive to any pain medicine you have been prescribed. SEEK IMMEDIATE MEDICAL CARE IF:   Pain cannot be controlled with the prescribed medicine.  You have a fever or shaking chills.  The severity or intensity of pain increases over 18 hours and is not relieved by pain medicine.  You develop a new onset of abdominal pain.  You feel faint or pass out.  You are unable to urinate.   This information is not intended to replace advice given to you by your health care provider. Make sure you discuss any questions you have with your health care provider.   Document Released: 03/06/2005 Document Revised: 11/25/2014 Document Reviewed: 08/07/2012 Elsevier Interactive Patient Education [employer].

## 2015-01-13 NOTE — Discharge Instructions (Signed)
Kidney Stones °Kidney stones (urolithiasis) are deposits that form inside your kidneys. The intense pain is caused by the stone moving through the urinary tract. When the stone moves, the ureter goes into spasm around the stone. The stone is usually passed in the urine.  °CAUSES  °· A disorder that makes certain neck glands produce too much parathyroid hormone (primary hyperparathyroidism). °· A buildup of uric acid crystals, similar to gout in your joints. °· Narrowing (stricture) of the ureter. °· A kidney obstruction present at birth (congenital obstruction). °· Previous surgery on the kidney or ureters. °· Numerous kidney infections. °SYMPTOMS  °· Feeling sick to your stomach (nauseous). °· Throwing up (vomiting). °· Blood in the urine (hematuria). °· Pain that usually spreads (radiates) to the groin. °· Frequency or urgency of urination. °DIAGNOSIS  °· Taking a history and physical exam. °· Blood or urine tests. °· CT scan. °· Occasionally, an examination of the inside of the urinary bladder (cystoscopy) is performed. °TREATMENT  °· Observation. °· Increasing your fluid intake. °· Extracorporeal shock wave lithotripsy--This is a noninvasive procedure that uses shock waves to break up kidney stones. °· Surgery may be needed if you have severe pain or persistent obstruction. There are various surgical procedures. Most of the procedures are performed with the use of small instruments. Only small incisions are needed to accommodate these instruments, so recovery time is minimized. °The size, location, and chemical composition are all important variables that will determine the proper choice of action for you. Talk to your health care provider to better understand your situation so that you will minimize the risk of injury to yourself and your kidney.  °HOME CARE INSTRUCTIONS  °· Drink enough water and fluids to keep your urine clear or pale yellow. This will help you to pass the stone or stone fragments. °· Strain  all urine through the provided strainer. Keep all particulate matter and stones for your health care provider to see. The stone causing the pain may be as small as a grain of salt. It is very important to use the strainer each and every time you pass your urine. The collection of your stone will allow your health care provider to analyze it and verify that a stone has actually passed. The stone analysis will often identify what you can do to reduce the incidence of recurrences. °· Only take over-the-counter or prescription medicines for pain, discomfort, or fever as directed by your health care provider. °· Keep all follow-up visits as told by your health care provider. This is important. °· Get follow-up X-rays if required. The absence of pain does not always mean that the stone has passed. It may have only stopped moving. If the urine remains completely obstructed, it can cause loss of kidney function or even complete destruction of the kidney. It is your responsibility to make sure X-rays and follow-ups are completed. Ultrasounds of the kidney can show blockages and the status of the kidney. Ultrasounds are not associated with any radiation and can be performed easily in a matter of minutes. °· Make changes to your daily diet as told by your health care provider. You may be told to: °¨ Limit the amount of salt that you eat. °¨ Eat 5 or more servings of fruits and vegetables each day. °¨ Limit the amount of meat, poultry, fish, and eggs that you eat. °· Collect a 24-hour urine sample as told by your health care provider. You may need to collect another urine sample every 6-12   months. °SEEK MEDICAL CARE IF: °· You experience pain that is progressive and unresponsive to any pain medicine you have been prescribed. °SEEK IMMEDIATE MEDICAL CARE IF:  °· Pain cannot be controlled with the prescribed medicine. °· You have a fever or shaking chills. °· The severity or intensity of pain increases over 18 hours and is not  relieved by pain medicine. °· You develop a new onset of abdominal pain. °· You feel faint or pass out. °· You are unable to urinate. °  °This information is not intended to replace advice given to you by your health care provider. Make sure you discuss any questions you have with your health care provider. °  °Document Released: 03/06/2005 Document Revised: 11/25/2014 Document Reviewed: 08/07/2012 °Elsevier Interactive Patient Education ©2016 [employer] ° °

## 2015-01-13 NOTE — ED Notes (Signed)
Per lab, CMP and Lipase still running.

## 2015-01-13 NOTE — ED Provider Notes (Signed)
[facility] Emergency Department Provider Note  ____________________________________________  Time seen: Approximately 12:30 PM  I have reviewed the triage vital signs and the nursing notes.   HISTORY  Chief Complaint Flank Pain    HPI Sara Stephenson is a 81 y.o. female who was discharged from the emergency department at 8 AM with 2 mm left-sided stone at the UVJ presenting with uncontrolled pain. The patient states that she was discharged just after a by mouth tablet of Toradol, "which did not touch the pain." She describes a sharp left-sided flank pain that radiates to the left lower quadrant. She has had nausea. She denies any vomiting, fever. She does have difficulty and pain with urination. She did not try any other medications.   Past Medical History  Diagnosis Date  . Rib fracture, right, with routine healing, subsequent encounter 05/2010    fall injury  . COPD (chronic obstructive pulmonary disease) ([place])   . Kidney stone     Patient Active Problem List   Diagnosis Date Noted  . Elevated blood pressure 04/18/2012  . COPD (chronic obstructive pulmonary disease) ([place]) 08/08/2011  . DEGENERATIVE JOINT DISEASE, CERVICAL SPINE 06/01/2009  . EPIDERMOID CYST 03/03/2008  . GANGLION CYST, WRIST, RIGHT 03/03/2008  . Overweight (BMI 25.0-29.9) 05/29/2006  . HYPERLIPIDEMIA 05/17/2006  . GASTROESOPHAGEAL REFLUX, NO ESOPHAGITIS 05/17/2006  . ACTINIC DERMATITIS 05/17/2006  . OSTEOARTHRITIS, MULTI SITES 05/17/2006  . OSTEOPENIA 05/17/2006    Past Surgical History  Procedure Laterality Date  . Tonsillectomy    . Breast biopsy  1954    left benign  . Retinal detachment surgery  1974    right  . Squamous cell carcinoma excision  2012    right bridge of nose  . Appendectomy      Current Outpatient Rx  Name  Route  Sig  Dispense  Refill  . Acetaminophen (ACETAMINOPHEN 8 HOUR) 650 MG TABS   Oral   Take 1 tablet by mouth every 6 (six) hours as  needed.         . Aclidinium Bromide (TUDORZA PRESSAIR) 400 MCG/ACT AEPB   Inhalation   Inhale 1 puff into the lungs 2 (two) times daily.         Marland Kitchen albuterol (PROAIR HFA) 108 (90 BASE) MCG/ACT inhaler   Inhalation   Inhale 2 puffs into the lungs every 6 (six) hours as needed for wheezing.   1 Inhaler   11   . aspirin 81 MG tablet   Oral   Take 81 mg by mouth daily.         . Biotin (BIOTIN MAXIMUM STRENGTH) 10 MG TABS   Oral   Take 1 tablet by mouth daily.         . Calcium Carbonate-Vitamin D (CALTRATE 600+D) 600-400 MG-UNIT per tablet   Oral   Take 1 tablet by mouth 2 (two) times daily.           . Cyanocobalamin (B-12) 500 MCG TABS   Oral   Take 500 mcg by mouth daily after breakfast.         . ibuprofen (ADVIL,MOTRIN) 200 MG tablet   Oral   Take 200 mg by mouth 2 (two) times daily.          Marland Kitchen ketorolac (TORADOL) 10 MG tablet   Oral   Take 1 tablet (10 mg total) by mouth every 8 (eight) hours as needed.   20 tablet   0   . ondansetron (ZOFRAN ODT)  4 MG disintegrating tablet   Oral   Take 1 tablet (4 mg total) by mouth every 8 (eight) hours as needed for nausea or vomiting.   20 tablet   0   . oxyCODONE-acetaminophen (PERCOCET/ROXICET) 5-325 MG tablet   Oral   Take 1 tablet by mouth every 6 (six) hours as needed for severe pain.   15 tablet   0   . scopolamine (TRANSDERM-SCOP) 1.5 MG   Transdermal   Place 1 patch (1.5 mg total) onto the skin every 3 (three) days.   4 patch   3   . tamsulosin (FLOMAX) 0.4 MG CAPS capsule   Oral   Take 1 capsule (0.4 mg total) by mouth daily.   12 capsule   0   . traMADol (ULTRAM) 50 MG tablet   Oral   Take 1 tablet (50 mg total) by mouth every 8 (eight) hours as needed for pain.   90 tablet   11     Allergies Tramadol; Sulfonamide derivatives; Hydrocodone; Levofloxacin; Pneumococcal vaccine polyvalent; Codeine; and Spiriva  Family History  Problem Relation Age of Onset  . Diabetes Maternal  Grandmother     Social History Social History  Substance Use Topics  . Smoking status: Former Smoker -- 0.50 packs/day for 33 years    Types: Cigarettes  . Smokeless tobacco: None  . Alcohol Use: No    Review of Systems Constitutional: No fever/chills. No syncope or lightheadedness. Eyes: No visual changes. ENT: No sore throat. Cardiovascular: Denies chest pain, palpitations. Respiratory: Denies shortness of breath.  No cough. Gastrointestinal: Positive for left flank pain radiating to the left lower quadrant.  Positive nausea, no vomiting.  No diarrhea.  No constipation. Genitourinary: Positive for difficulty with urination and increased frequency. No hematuria Musculoskeletal: Negative for back pain. Skin: Negative for rash. Neurological: Negative for headaches, focal weakness or numbness.  10-point ROS otherwise negative.  ____________________________________________   PHYSICAL EXAM:  VITAL SIGNS: ED Triage Vitals  Enc Vitals Group     BP 01/13/15 1157 136/96 mmHg     Pulse Rate 01/13/15 1157 64     Resp 01/13/15 1157 24     Temp 01/13/15 1157 97.5 F (36.4 C)     Temp Source 01/13/15 1157 Oral     SpO2 01/13/15 1154 100 %     Weight 01/13/15 1157 179 lb (81.194 kg)     Height 01/13/15 1157 5\' 4"  (1.626 m)     Head Cir --      Peak Flow --      Pain Score 01/13/15 1159 9     Pain Loc --      Pain Edu? --      Excl. in [place]? --     Constitutional: Patient is alert and oriented and answering questions appropriate. She is sitting on the edge of the bed and unable to get comfortable, tearful due to pain.  Eyes: Conjunctivae are normal.  EOMI. Head: Atraumatic. Nose: No congestion/rhinnorhea. Mouth/Throat: Mucous membranes are moist.  Neck: No stridor.  Supple.   Cardiovascular: Normal rate, regular rhythm. No murmurs, rubs or gallops.  Respiratory: Normal respiratory effort.  No retractions. Lungs CTAB.  No wheezes, rales or ronchi. Gastrointestinal: Positive  for left flank pain, mild tenderness to palpation in the left lower quadrant. Her abdomen is soft, nondistended, without guarding or rebound. No peritoneal signs. Musculoskeletal: No LE edema.  Neurologic:  Normal speech and language. No gross focal neurologic deficits are appreciated.  Skin:  Skin is warm, dry and intact. No rash noted. Psychiatric: Mood and affect are normal. Speech and behavior are normal.  Normal judgement.  ____________________________________________   LABS (all labs ordered are listed, but only abnormal results are displayed)  Labs Reviewed  URINALYSIS COMPLETEWITH MICROSCOPIC ([place]) - Abnormal; Notable for the following:    Color, Urine YELLOW (*)    APPearance CLEAR (*)    Ketones, ur 1+ (*)    Hgb urine dipstick 2+ (*)    All other components within normal limits  CBC  BASIC METABOLIC PANEL   ____________________________________________  EKG  Not indicated ____________________________________________  RADIOLOGY  Ct Renal Stone Study  01/13/2015  CLINICAL DATA:  Urinary frequency beginning yesterday, new onset LEFT flank pain 1 hour ago. No history of kidney stones. EXAM: CT ABDOMEN AND PELVIS WITHOUT CONTRAST TECHNIQUE: Multidetector CT imaging of the abdomen and pelvis was performed following the standard protocol without IV contrast. COMPARISON:  CT abdomen and pelvis December 08, 2012 FINDINGS: LUNG BASES: Lingular and RIGHT middle lobe atelectasis. Included heart size is normal, no pericardial effusions. KIDNEYS/BLADDER: Kidneys are orthotopic, demonstrating normal size and morphology. Mild LEFT hydroureteronephrosis to the level of the ureterovesicular junction where a 2 mm calculus is present. No residual nephrolithiasis. Superimposed LEFT parapelvic cysts and 13 mm LEFT upper pole cyst better seen on prior contrast-enhanced CT. Limited assessment for renal masses on this nonenhanced examination. Urinary bladder is partially distended and  unremarkable. SOLID ORGANS: The liver, spleen, gallbladder, pancreas and adrenal glands are unremarkable for this non-contrast examination. GASTROINTESTINAL TRACT: Moderate hiatal hernia. The stomach, small and large bowel are normal in course and caliber without inflammatory changes, the sensitivity may be decreased by lack of enteric contrast. Moderate transverse and descending colonic diverticulosis with colonic redundancy. Status post appendectomy. PERITONEUM/RETROPERITONEUM: Aortoiliac vessels are normal in course and caliber, mild calcific atherosclerosis. No lymphadenopathy by CT size criteria. Internal reproductive organs are unremarkable. No intraperitoneal free fluid nor free air. Phleboliths in the pelvis. SOFT TISSUES/ OSSEOUS STRUCTURES: Nonsuspicious. Multilevel severe degenerative discs resulting in moderate LEFT L4-5 neural foraminal narrowing. IMPRESSION: 2 mm LEFT ureterovesicular junction calculus resulting in mild obstructive uropathy. No residual nephrolithiasis. Colonic diverticulosis without acute diverticulitis. Electronically Signed   By: Elon Alas M.D.   On: 01/13/2015 06:32    ____________________________________________   PROCEDURES  Procedure(s) performed: None  Critical Care performed: No ____________________________________________   INITIAL IMPRESSION / ASSESSMENT AND PLAN / ED COURSE  Pertinent labs & imaging results that were available during my care of the patient were reviewed by me and considered in my medical decision making (see chart for details).  81 y.o. F discharged this morning from the emergency department with a 2 mm left-sided renal stone who is having uncontrolled pain but did not try any additional medications after discharge. She has some nausea without vomiting. She is afebrile. The patient is sparing, also I will plan IV fluids, and attempt to gain better pain control with narcotic  medications.  ----------------------------------------- 1:04 PM on 01/13/2015 -----------------------------------------  Patient pain has always completely resolved and her nausea has resolved. I will transition her to oral Percocet in the emergency department to make sure that she tolerates this medication and she has had some sensitivities with tramadol in the past. If she is able to tolerate this medication, we will plan to discharge her home with Percocet, Flomax, and Zofran. She will continue to strain her urine and follow up with [place]. ____________________________________________  FINAL CLINICAL  IMPRESSION(S) / ED DIAGNOSES  Final diagnoses:  Renal colic on left side  Nausea      NEW MEDICATIONS STARTED DURING THIS VISIT:  New Prescriptions   ONDANSETRON (ZOFRAN ODT) 4 MG DISINTEGRATING TABLET    Take 1 tablet (4 mg total) by mouth every 8 (eight) hours as needed for nausea or vomiting.   OXYCODONE-ACETAMINOPHEN (PERCOCET/ROXICET) 5-325 MG TABLET    Take 1 tablet by mouth every 6 (six) hours as needed for severe pain.   TAMSULOSIN (FLOMAX) 0.4 MG CAPS CAPSULE    Take 1 capsule (0.4 mg total) by mouth daily.     Eula Listen, MD 01/13/15 [phone]

## 2015-01-20 ENCOUNTER — Ambulatory Visit: Payer: Self-pay | Admitting: Obstetrics and Gynecology

## 2015-01-21 ENCOUNTER — Encounter: Payer: Self-pay | Admitting: Obstetrics and Gynecology

## 2015-01-21 ENCOUNTER — Ambulatory Visit (INDEPENDENT_AMBULATORY_CARE_PROVIDER_SITE_OTHER): Payer: Medicare Other | Admitting: Obstetrics and Gynecology

## 2015-01-21 VITALS — BP 143/76 | HR 83 | Resp 18 | Ht 64.0 in | Wt 178.4 lb

## 2015-01-21 DIAGNOSIS — N200 Calculus of kidney: Secondary | ICD-10-CM

## 2015-01-21 LAB — URINALYSIS, COMPLETE
BILIRUBIN UA: NEGATIVE
GLUCOSE, UA: NEGATIVE
Ketones, UA: NEGATIVE
NITRITE UA: NEGATIVE
PROTEIN UA: NEGATIVE
RBC UA: NEGATIVE
Specific Gravity, UA: 1.02 (ref 1.005–1.030)
UUROB: 0.2 mg/dL (ref 0.2–1.0)
pH, UA: 6.5 (ref 5.0–7.5)

## 2015-01-21 LAB — MICROSCOPIC EXAMINATION
Bacteria, UA: NONE SEEN
Epithelial Cells (non renal): NONE SEEN /hpf (ref 0–10)
RBC, UA: NONE SEEN /hpf (ref 0–?)
Renal Epithel, UA: NONE SEEN /hpf
WBC UA: NONE SEEN /HPF (ref 0–?)

## 2015-01-21 NOTE — Patient Instructions (Signed)
Kidney Stones Kidney stones (urolithiasis) are deposits that form inside your kidneys. The intense pain is caused by the stone moving through the urinary tract. When the stone moves, the ureter goes into spasm around the stone. The stone is usually passed in the urine.  CAUSES   A disorder that makes certain neck glands produce too much parathyroid hormone (primary hyperparathyroidism).  A buildup of uric acid crystals, similar to gout in your joints.  Narrowing (stricture) of the ureter.  A kidney obstruction present at birth (congenital obstruction).  Previous surgery on the kidney or ureters.  Numerous kidney infections. SYMPTOMS   Feeling sick to your stomach (nauseous).  Throwing up (vomiting).  Blood in the urine (hematuria).  Pain that usually spreads (radiates) to the groin.  Frequency or urgency of urination. DIAGNOSIS   Taking a history and physical exam.  Blood or urine tests.  CT scan.  Occasionally, an examination of the inside of the urinary bladder (cystoscopy) is performed. TREATMENT   Observation.  Increasing your fluid intake.  Extracorporeal shock wave lithotripsy--This is a noninvasive procedure that uses shock waves to break up kidney stones.  Surgery may be needed if you have severe pain or persistent obstruction. There are various surgical procedures. Most of the procedures are performed with the use of small instruments. Only small incisions are needed to accommodate these instruments, so recovery time is minimized. The size, location, and chemical composition are all important variables that will determine the proper choice of action for you. Talk to your health care provider to better understand your situation so that you will minimize the risk of injury to yourself and your kidney.  HOME CARE INSTRUCTIONS   Drink enough water and fluids to keep your urine clear or pale yellow. This will help you to pass the stone or stone fragments.  Strain  all urine through the provided strainer. Keep all particulate matter and stones for your health care provider to see. The stone causing the pain may be as small as a grain of salt. It is very important to use the strainer each and every time you pass your urine. The collection of your stone will allow your health care provider to analyze it and verify that a stone has actually passed. The stone analysis will often identify what you can do to reduce the incidence of recurrences.  Only take over-the-counter or prescription medicines for pain, discomfort, or fever as directed by your health care provider.  Keep all follow-up visits as told by your health care provider. This is important.  Get follow-up X-rays if required. The absence of pain does not always mean that the stone has passed. It may have only stopped moving. If the urine remains completely obstructed, it can cause loss of kidney function or even complete destruction of the kidney. It is your responsibility to make sure X-rays and follow-ups are completed. Ultrasounds of the kidney can show blockages and the status of the kidney. Ultrasounds are not associated with any radiation and can be performed easily in a matter of minutes.  Make changes to your daily diet as told by your health care provider. You may be told to:  Limit the amount of salt that you eat.  Eat 5 or more servings of fruits and vegetables each day.  Limit the amount of meat, poultry, fish, and eggs that you eat.  Collect a 24-hour urine sample as told by your health care provider.You may need to collect another urine sample every 6-12   months. SEEK MEDICAL CARE IF:  You experience pain that is progressive and unresponsive to any pain medicine you have been prescribed. SEEK IMMEDIATE MEDICAL CARE IF:   Pain cannot be controlled with the prescribed medicine.  You have a fever or shaking chills.  The severity or intensity of pain increases over 18 hours and is not  relieved by pain medicine.  You develop a new onset of abdominal pain.  You feel faint or pass out.  You are unable to urinate.   This information is not intended to replace advice given to you by your health care provider. Make sure you discuss any questions you have with your health care provider.   Document Released: 03/06/2005 Document Revised: 11/25/2014 Document Reviewed: 08/07/2012 Elsevier Interactive Patient Education 2016 [employer] Dietary Guidelines to Help Prevent Kidney Stones Your risk of kidney stones can be decreased by adjusting the foods you eat. The most important thing you can do is drink enough fluid. You should drink enough fluid to keep your urine clear or pale yellow. The following guidelines provide specific information for the type of kidney stone you have had. GUIDELINES ACCORDING TO TYPE OF KIDNEY STONE Calcium Oxalate Kidney Stones  Reduce the amount of salt you eat. Foods that have a lot of salt cause your body to release excess calcium into your urine. The excess calcium can combine with a substance called oxalate to form kidney stones.  Reduce the amount of animal protein you eat if the amount you eat is excessive. Animal protein causes your body to release excess calcium into your urine. Ask your dietitian how much protein from animal sources you should be eating.  Avoid foods that are high in oxalates. If you take vitamins, they should have less than 500 mg of vitamin C. Your body turns vitamin C into oxalates. You do not need to avoid fruits and vegetables high in vitamin C. Calcium Phosphate Kidney Stones  Reduce the amount of salt you eat to help prevent the release of excess calcium into your urine.  Reduce the amount of animal protein you eat if the amount you eat is excessive. Animal protein causes your body to release excess calcium into your urine. Ask your dietitian how much protein from animal sources you should be eating.  Get enough  calcium from food or take a calcium supplement (ask your dietitian for recommendations). Food sources of calcium that do not increase your risk of kidney stones include:  Broccoli.  Dairy products, such as cheese and yogurt.  Pudding. Uric Acid Kidney Stones  Do not have more than 6 oz of animal protein per day. FOOD SOURCES Animal Protein Sources  Meat (all types).  Poultry.  Eggs.  Fish, seafood. Foods High in Salt  Salt seasonings.  Soy sauce.  Teriyaki sauce.  Cured and processed meats.  Salted crackers and snack foods.  Fast food.  Canned soups and most canned foods. Foods High in Oxalates  Grains:  Amaranth.  Barley.  Grits.  Wheat germ.  Bran.  Buckwheat flour.  All bran cereals.  Pretzels.  Whole wheat bread.  Vegetables:  Beans (wax).  Beets and beet greens.  Collard greens.  Eggplant.  [place].  Leeks.  Okra.  Parsley.  R[place]abagas.  Spinach.  Swiss chard.  Tomato paste.  Fried potatoes.  Sweet potatoes.  Fruits:  Red currants.  Figs.  Kiwi.  Rhubarb.  Meat and Other Protein Sources:  Beans (dried).  Soy burgers and other soybean products.  Miso.    Nuts (peanuts, almonds, pecans, cashews, hazelnuts).  Nut butters.  Sesame seeds and tahini (paste made of sesame seeds).  Poppy seeds.  Beverages:  Chocolate drink mixes.  Soy milk.  Instant iced tea.  Juices made from high-oxalate fruits or vegetables.  Other:  Carob.  Chocolate.  Fruitcake.  Marmalades.   This information is not intended to replace advice given to you by your health care provider. Make sure you discuss any questions you have with your health care provider.   Document Released: 07/01/2010 Document Revised: 03/11/2013 Document Reviewed: 01/31/2013 Elsevier Interactive Patient Education 2016 [employer] Inc[employer]  

## 2015-01-21 NOTE — Progress Notes (Signed)
01/21/2015 10:33 AM   Gerre Couch [birth year] [id number]  Referring provider: Albina Billet, MD [address]   [place], [place] [address]  Chief Complaint  Patient presents with  . Nephrolithiasis  . Establish Care    HPI: Patient is an 81yo female presenting today for f/u after being seen in the ED 01/16/15 for acute onset of left flank pain. CT renal stone 2 mm LEFT ureterovesicular junction calculus resulting in mild obstructive uropathy. No residual nephrolithiasis.  This is her first stone episode. Patient states that her pain resolved soon after leaving the ED. She states that she felt dizzy while taking the Flomax.  She did not take it today and states she is feeling better.  01/13/15 UA- RBCs TNTC Serum WBC 9.9 Cr 0.80  PMH: Past Medical History  Diagnosis Date  . Rib fracture, right, with routine healing, subsequent encounter 05/2010    fall injury  . COPD (chronic obstructive pulmonary disease) ([place])   . Kidney stone     Surgical History: Past Surgical History  Procedure Laterality Date  . Tonsillectomy    . Breast biopsy  1954    left benign  . Retinal detachment surgery  1974    right  . Squamous cell carcinoma excision  2012    right bridge of nose  . Appendectomy      Home Medications:    Medication List       This list is accurate as of: 01/21/15 10:33 AM.  Always use your most recent med list.               albuterol 108 (90 BASE) MCG/ACT inhaler  Commonly known as:  PROAIR HFA  Inhale 2 puffs into the lungs every 6 (six) hours as needed for wheezing.     aspirin 81 MG tablet  Take 81 mg by mouth daily.     B-12 500 MCG Tabs  Take 500 mcg by mouth daily after breakfast.     BIOTIN MAXIMUM STRENGTH 10 MG Tabs  Generic drug:  Biotin  Take 1 tablet by mouth daily.     CALTRATE 600+D 600-400 MG-UNIT tablet  Generic drug:  Calcium Carbonate-Vitamin D  Take 1 tablet by mouth 2 (two) times daily.     ibuprofen 200 MG tablet    Commonly known as:  ADVIL,MOTRIN  Take 200 mg by mouth 2 (two) times daily.     ketorolac 10 MG tablet  Commonly known as:  TORADOL  Take 1 tablet (10 mg total) by mouth every 8 (eight) hours as needed.     oxyCODONE-acetaminophen 5-325 MG tablet  Commonly known as:  PERCOCET/ROXICET  Take 1 tablet by mouth every 6 (six) hours as needed for severe pain.     tamsulosin 0.4 MG Caps capsule  Commonly known as:  FLOMAX  Take 1 capsule (0.4 mg total) by mouth daily.     traMADol 50 MG tablet  Commonly known as:  ULTRAM  Take 1 tablet (50 mg total) by mouth every 8 (eight) hours as needed for pain.     TUDORZA PRESSAIR 400 MCG/ACT Aepb  Generic drug:  Aclidinium Bromide  Inhale 1 puff into the lungs 2 (two) times daily.        Allergies:  Allergies  Allergen Reactions  . Tramadol Anaphylaxis  . Sulfonamide Derivatives Dermatitis    REACTION: hypotension, red blotchy patches on skin  . Hydrocodone Itching  . Levofloxacin   . Pneumococcal Vaccine Polyvalent  Other (See Comments)    REACTION: Sore arm, visual changes  . Codeine Rash    REACTION: rash and headache  . Spiriva [Tiotropium Bromide Monohydrate] Other (See Comments)     nasal congestion and numbness of her upper lip.    Family History: Family History  Problem Relation Age of Onset  . Diabetes Maternal Grandmother     Social History:  reports that she has quit smoking. Her smoking use included Cigarettes. She has a 16.5 pack-year smoking history. She does not have any smokeless tobacco history on file. She reports that she does not drink alcohol or use illicit drugs.  ROS: UROLOGY Frequent Urination?: No Hard to postpone urination?: No Burning/pain with urination?: No Get up at night to urinate?: No Leakage of urine?: No Urine stream starts and stops?: No Trouble starting stream?: No Do you have to strain to urinate?: No Blood in urine?: No Urinary tract infection?: No Sexually transmitted disease?:  No Injury to kidneys or bladder?: No Painful intercourse?: No Weak stream?: No Currently pregnant?: No Vaginal bleeding?: No Last menstrual period?: n  Gastrointestinal Nausea?: No Vomiting?: No Indigestion/heartburn?: No Diarrhea?: No Constipation?: No  Constitutional Fever: No Night sweats?: No Weight loss?: No Fatigue?: No  Skin Skin rash/lesions?: No Itching?: No  Eyes Blurred vision?: No Double vision?: No  Ears/Nose/Throat Sore throat?: Yes Sinus problems?: No  Hematologic/Lymphatic Swollen glands?: No Easy bruising?: No  Cardiovascular Leg swelling?: No Chest pain?: No  Respiratory Cough?: Yes Shortness of breath?: Yes  Endocrine Excessive thirst?: No  Musculoskeletal Back pain?: Yes Joint pain?: Yes  Neurological Headaches?: No Dizziness?: No  Psychologic Depression?: No Anxiety?: No  Physical Exam: BP 143/76 mmHg  Pulse 83  Resp 18  Ht 5\' 4"  (1.626 m)  Wt 178 lb 6.4 oz (80.922 kg)  BMI 30.61 kg/m2  Constitutional:  Alert and oriented, No acute distress. HEENT: [place] AT, moist mucus membranes.  Trachea midline, no masses. Cardiovascular: No clubbing, cyanosis, or edema. Respiratory: Normal respiratory effort, no increased work of breathing. GI: Abdomen is soft, nontender, nondistended, no abdominal masses GU: No CVA tenderness.  Skin: No rashes, bruises or suspicious lesions. Lymph: No cervical or inguinal adenopathy. Neurologic: Grossly intact, no focal deficits, moving all 4 extremities. Psychiatric: Normal mood and affect.  Laboratory Data: Lab Results  Component Value Date   WBC 9.9 01/13/2015   HGB 12.6 01/13/2015   HCT 38.0 01/13/2015   MCV 90.1 01/13/2015   PLT 196 01/13/2015    Lab Results  Component Value Date   CREATININE 0.80 01/13/2015    No results found for: PSA  No results found for: TESTOSTERONE  No results found for: HGBA1C  Urinalysis  Pertinent Imaging: EXAM: CT ABDOMEN AND PELVIS WITHOUT  CONTRAST  TECHNIQUE: Multidetector CT imaging of the abdomen and pelvis was performed following the standard protocol without IV contrast.  COMPARISON: CT abdomen and pelvis December 08, 2012  FINDINGS: LUNG BASES: Lingular and RIGHT middle lobe atelectasis. Included heart size is normal, no pericardial effusions.  KIDNEYS/BLADDER: Kidneys are orthotopic, demonstrating normal size and morphology. Mild LEFT hydroureteronephrosis to the level of the ureterovesicular junction where a 2 mm calculus is present. No residual nephrolithiasis. Superimposed LEFT parapelvic cysts and 13 mm LEFT upper pole cyst better seen on prior contrast-enhanced CT. Limited assessment for renal masses on this nonenhanced examination. Urinary bladder is partially distended and unremarkable.  SOLID ORGANS: The liver, spleen, gallbladder, pancreas and adrenal glands are unremarkable for this non-contrast examination.  GASTROINTESTINAL TRACT:  Moderate hiatal hernia. The stomach, small and large bowel are normal in course and caliber without inflammatory changes, the sensitivity may be decreased by lack of enteric contrast. Moderate transverse and descending colonic diverticulosis with colonic redundancy. Status post appendectomy.  PERITONEUM/RETROPERITONEUM: Aortoiliac vessels are normal in course and caliber, mild calcific atherosclerosis. No lymphadenopathy by CT size criteria. Internal reproductive organs are unremarkable. No intraperitoneal free fluid nor free air. Phleboliths in the pelvis.  SOFT TISSUES/ OSSEOUS STRUCTURES: Nonsuspicious. Multilevel severe degenerative discs resulting in moderate LEFT L4-5 neural foraminal narrowing.  IMPRESSION: 2 mm LEFT ureterovesicular junction calculus resulting in mild obstructive uropathy. No residual nephrolithiasis.  Colonic diverticulosis without acute diverticulitis. Electronically Signed  By: Elon Alas M.D.  On: 01/13/2015  06:32   Assessment & Plan:    1. Nephrolithiasis- Pain has resolved.  Patient unsure if she has passed her stone.  She was not provided a strainer in the ED. Patient states that she is going on vacation in 2 weeks and would like to be sure the stone has passed.  Patient instructed to discontinue Flomax d/t symptoms of dizziness.  I suspect she has mos tlikely passed her stone. RUS in 1 week to confirm resolution of obstruction and patient will f/u for results. - Urinalysis, Complete  2. Microscopic hematuria- TNTC seen in ED. UA clear today.  Pertinent labs & imaging results that were available during my care of the patient were reviewed by me and considered in my medical decision making (see chart for details).  Return in about 10 days (around 01/31/2015) for RUS results.  Herbert Moors, [facility] [address] [place], [place] [address] [phone]

## 2015-01-27 ENCOUNTER — Ambulatory Visit: Payer: Self-pay | Admitting: Obstetrics and Gynecology

## 2015-01-28 ENCOUNTER — Ambulatory Visit
Admission: RE | Admit: 2015-01-28 | Discharge: 2015-01-28 | Disposition: A | Discharge status: Home / Self Care | Payer: Medicare Other | Source: Ambulatory Visit | Attending: Obstetrics and Gynecology | Admitting: Obstetrics and Gynecology

## 2015-01-28 DIAGNOSIS — N281 Cyst of kidney, acquired: Secondary | ICD-10-CM | POA: Diagnosis not present

## 2015-01-28 DIAGNOSIS — N200 Calculus of kidney: Secondary | ICD-10-CM | POA: Insufficient documentation

## 2015-02-01 ENCOUNTER — Ambulatory Visit (INDEPENDENT_AMBULATORY_CARE_PROVIDER_SITE_OTHER): Payer: Medicare Other | Admitting: Obstetrics and Gynecology

## 2015-02-01 ENCOUNTER — Encounter: Payer: Self-pay | Admitting: Obstetrics and Gynecology

## 2015-02-01 VITALS — BP 144/82 | HR 93 | Resp 16 | Ht 64.0 in | Wt 176.1 lb

## 2015-02-01 DIAGNOSIS — N200 Calculus of kidney: Secondary | ICD-10-CM

## 2015-02-01 LAB — URINALYSIS, COMPLETE
Bilirubin, UA: NEGATIVE
Glucose, UA: NEGATIVE
KETONES UA: NEGATIVE
NITRITE UA: NEGATIVE
Protein, UA: NEGATIVE
RBC, UA: NEGATIVE
SPEC GRAV UA: 1.02 (ref 1.005–1.030)
Urobilinogen, Ur: 0.2 mg/dL (ref 0.2–1.0)
pH, UA: 7 (ref 5.0–7.5)

## 2015-02-01 LAB — MICROSCOPIC EXAMINATION

## 2015-02-01 NOTE — Progress Notes (Signed)
02/01/2015 2:13 PM   Sara Stephenson [birth year] [id number]  Referring provider: Albina Billet, MD [address]   [place], [place] [address]  Chief Complaint  Patient presents with  . Nephrolithiasis    HPI: Patient is an 81 year old female presenting today for follow-up on 2 mm left UVJ stone seen on CT scan. Renal ultrasound was ordered for confirmation of resolution of previous left-sided hydronephrosis.  She presents today to review RUS findings. She continues to deny any pain or urinary symptoms.  RUS 01/28/15  IMPRESSION: Small cyst arising from the upper pole left kidney. Study otherwise unremarkable. The hydronephrosis noted on the left on prior CT has resolved in the interval.  Previous History: Patient is an 81yo female presenting today for f/u after being seen in the ED 01/16/15 for acute onset of left flank pain. CT renal stone 2 mm LEFT ureterovesicular junction calculus resulting in mild obstructive uropathy. No residual nephrolithiasis. This is her first stone episode. Patient states that her pain resolved soon after leaving the ED. She states that she felt dizzy while taking the Flomax. She did not take it today and states she is feeling better.  PMH: Past Medical History  Diagnosis Date  . Rib fracture, right, with routine healing, subsequent encounter 05/2010    fall injury  . COPD (chronic obstructive pulmonary disease) ([place])   . Kidney stone     Surgical History: Past Surgical History  Procedure Laterality Date  . Tonsillectomy    . Breast biopsy  1954    left benign  . Retinal detachment surgery  1974    right  . Squamous cell carcinoma excision  2012    right bridge of nose  . Appendectomy      Home Medications:    Medication List       This list is accurate as of: 02/01/15  2:13 PM.  Always use your most recent med list.               albuterol 108 (90 BASE) MCG/ACT inhaler  Commonly known as:  PROAIR HFA  Inhale 2 puffs into the  lungs every 6 (six) hours as needed for wheezing.     aspirin 81 MG tablet  Take 81 mg by mouth daily.     B-12 500 MCG Tabs  Take 500 mcg by mouth daily after breakfast.     BIOTIN MAXIMUM STRENGTH 10 MG Tabs  Generic drug:  Biotin  Take 1 tablet by mouth daily.     CALTRATE 600+D 600-400 MG-UNIT tablet  Generic drug:  Calcium Carbonate-Vitamin D  Take 1 tablet by mouth 2 (two) times daily.     ibuprofen 200 MG tablet  Commonly known as:  ADVIL,MOTRIN  Take 200 mg by mouth 2 (two) times daily.     oxyCODONE-acetaminophen 5-325 MG tablet  Commonly known as:  PERCOCET/ROXICET  Take 1 tablet by mouth every 6 (six) hours as needed for severe pain.     TUDORZA PRESSAIR 400 MCG/ACT Aepb  Generic drug:  Aclidinium Bromide  Inhale 1 puff into the lungs 2 (two) times daily.        Allergies:  Allergies  Allergen Reactions  . Tramadol Anaphylaxis  . Sulfonamide Derivatives Dermatitis    REACTION: hypotension, red blotchy patches on skin  . Hydrocodone Itching  . Levofloxacin   . Pneumococcal Vaccine Polyvalent Other (See Comments)    REACTION: Sore arm, visual changes  . Codeine Rash    REACTION: rash  and headache  . Spiriva [Tiotropium Bromide Monohydrate] Other (See Comments)     nasal congestion and numbness of her upper lip.    Family History: Family History  Problem Relation Age of Onset  . Diabetes Maternal Grandmother     Social History:  reports that she has quit smoking. Her smoking use included Cigarettes. She has a 16.5 pack-year smoking history. She does not have any smokeless tobacco history on file. She reports that she does not drink alcohol or use illicit drugs.  ROS: UROLOGY Frequent Urination?: No Hard to postpone urination?: No Burning/pain with urination?: No Get up at night to urinate?: No Leakage of urine?: No Urine stream starts and stops?: No Trouble starting stream?: No Do you have to strain to urinate?: No Blood in urine?: No Urinary  tract infection?: No Sexually transmitted disease?: No Injury to kidneys or bladder?: No Painful intercourse?: No Weak stream?: No Currently pregnant?: No Vaginal bleeding?: No Last menstrual period?: n  Gastrointestinal Nausea?: No Vomiting?: No Indigestion/heartburn?: No Diarrhea?: No Constipation?: No  Constitutional Fever: No Night sweats?: No Weight loss?: No Fatigue?: No  Skin Skin rash/lesions?: No Itching?: No  Eyes Blurred vision?: No Double vision?: No  Ears/Nose/Throat Sore throat?: No Sinus problems?: No  Hematologic/Lymphatic Swollen glands?: No Easy bruising?: No  Cardiovascular Leg swelling?: No Chest pain?: No  Respiratory Cough?: Yes Shortness of breath?: Yes  Endocrine Excessive thirst?: No  Musculoskeletal Back pain?: No Joint pain?: No  Neurological Headaches?: No Dizziness?: No  Psychologic Depression?: No Anxiety?: No  Physical Exam: BP 144/82 mmHg  Pulse 93  Resp 16  Ht 5\' 4"  (1.626 m)  Wt 176 lb 1.6 oz (79.878 kg)  BMI 30.21 kg/m2  Constitutional:  Alert and oriented, No acute distress. HEENT: [place] AT, moist mucus membranes.  Trachea midline, no masses. Cardiovascular: No clubbing, cyanosis, or edema. Respiratory: Normal respiratory effort, no increased work of breathing. GU: No CVA tenderness.  Skin: No rashes, bruises or suspicious lesions. Neurologic: Grossly intact, no focal deficits, moving all 4 extremities. Psychiatric: Normal mood and affect.  Laboratory Data:   Urinalysis    Component Value Date/Time   COLORURINE YELLOW* 01/13/2015 1227   COLORURINE St[place] 12/07/2012 2329   APPEARANCEUR CLEAR* 01/13/2015 1227   APPEARANCEUR Clear 12/07/2012 2329   LABSPEC 1.016 01/13/2015 1227   LABSPEC 1.005 12/07/2012 2329   PHURINE 6.0 01/13/2015 1227   PHURINE 8.0 12/07/2012 2329   GLUCOSEU Negative 01/21/2015 0941   GLUCOSEU Negative 12/07/2012 2329   HGBUR 2+* 01/13/2015 1227   HGBUR Negative 12/07/2012  2329   HGBUR negative 06/01/2009 0822   BILIRUBINUR Negative 01/21/2015 [place] 01/13/2015 1227   BILIRUBINUR Negative 12/07/2012 2329   KETONESUR 1+* 01/13/2015 1227   KETONESUR Trace 12/07/2012 2329   PROTEINUR NEGATIVE 01/13/2015 1227   PROTEINUR Negative 12/07/2012 2329   UROBILINOGEN 0.2 06/01/2009 0822   NITRITE Negative 01/21/2015 0941   NITRITE NEGATIVE 01/13/2015 1227   NITRITE Negative 12/07/2012 2329   LEUKOCYTESUR 1+* 01/21/2015 0941   LEUKOCYTESUR NEGATIVE 01/13/2015 1227   LEUKOCYTESUR Negative 12/07/2012 2329    Pertinent Imaging: CLINICAL DATA: Recent hydronephrosis due to left ureterovesical calculus  EXAM: RENAL / URINARY TRACT ULTRASOUND COMPLETE  COMPARISON: CT abdomen and pelvis January 13, 2015  FINDINGS: Right Kidney: Length: 9.5 cm. Echogenicity and renal cortical thickness are within normal limits. No mass, perinephric fluid, or hydronephrosis visualized. No sonographically demonstrable calculus or ureterectasis.  Left Kidney: Length: 11.5 cm. Echogenicity and renal cortical  thickness are within normal limits. No perinephric fluid or hydronephrosis visualized. There is a cyst arising from the upper pole the left kidney measuring 1.6 x 1.2 x 1.1 cm. No sonographically demonstrable calculus or ureterectasis.  Bladder: Appears normal for degree of bladder distention.  IMPRESSION: Small cyst arising from the upper pole left kidney. Study otherwise unremarkable. The hydronephrosis noted on the left on prior CT has resolved in the interval. Electronically Signed  By: Lowella Grip III M.D.  On: 01/28/2015 16:59  Assessment & Plan:    1. Nephrolithiasis- UA unremarkable today. Left UVJ stone seen on previous CT.  Patient reports complete resolution of pain. F/u RUS showing resolution of previous hydronephrosis. We discussed general stone prevention techniques including drinking plenty water with goal of  producing 2.5 L urine daily, increased citric acid intake, avoidance of high oxalate containing foods, and decreased salt intake.  Information about dietary recommendations given today.  - Urinalysis, Complete  Pertinent labs & imaging results that were available during my care of the patient were reviewed by me and considered in my medical decision making (see chart for details).  Return if symptoms worsen or fail to improve.  These notes generated with voice recognition software. I apologize for typographical errors.  Herbert Moors, [facility] [address], [place], [place] [address] [phone]

## 2015-04-28 IMAGING — CT CT ABD-PELV W/ CM
1 of 3 series · 14 of 32 positions shown, 19 images · IV contrast (isovue)
Comparison: None

REASON FOR EXAM: (1) rlq pain, leukocytosis; (2) rlq pain, leukocytosis
COMMENTS:

PROCEDURE:     CT  - CT ABDOMEN / PELVIS  W  - December 08, 2012  [DATE]
RESULT:     History: Right lower quadrant pain
TECHNIQUE: Multiple axial images of the abdomen and pelvis were performed
from the lung bases to the pubic symphysis, without p.o. contrast and with
100 ml of Isovue 300 intravenous contrast.

[Series 2: 3mm soft tissue · axial · 0.76mm/px · z∈[-456,-80]mm · 14 of 141 slices shown, 19 images]
[im 8/141  soft-tissue]
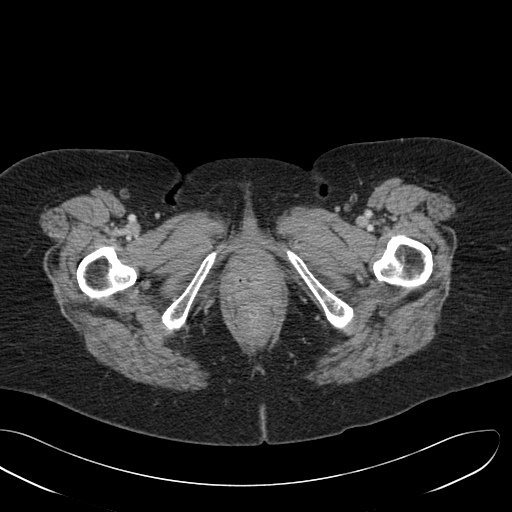
[im 8/141  bone]
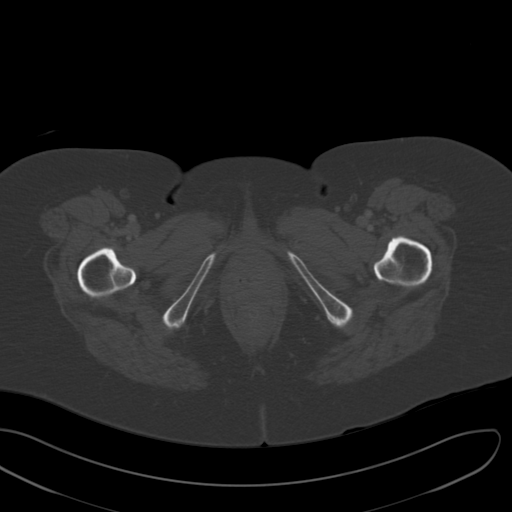
[im 16/141  soft-tissue]
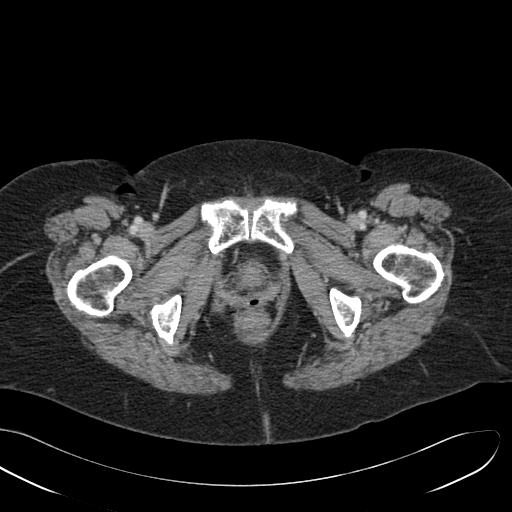
[im 32/141  soft-tissue]
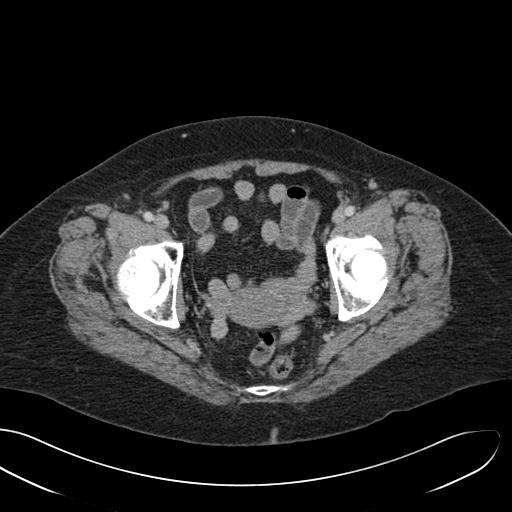
[im 39/141  soft-tissue]
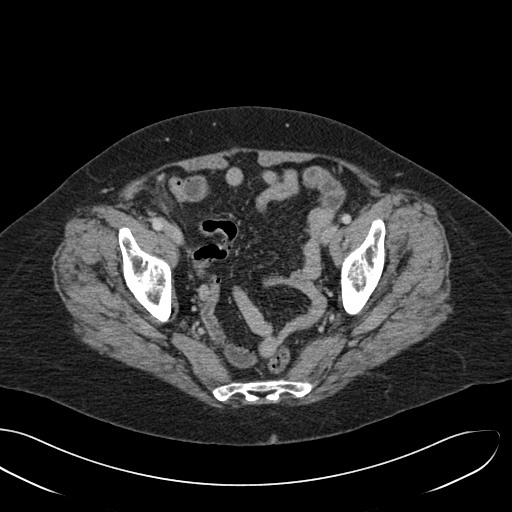
[im 47/141  soft-tissue]
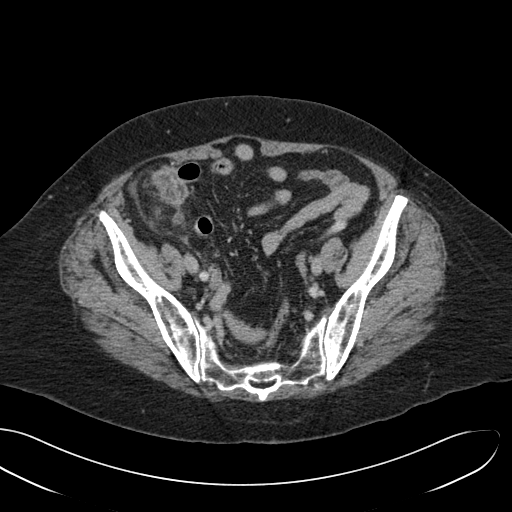
[im 63/141  soft-tissue]
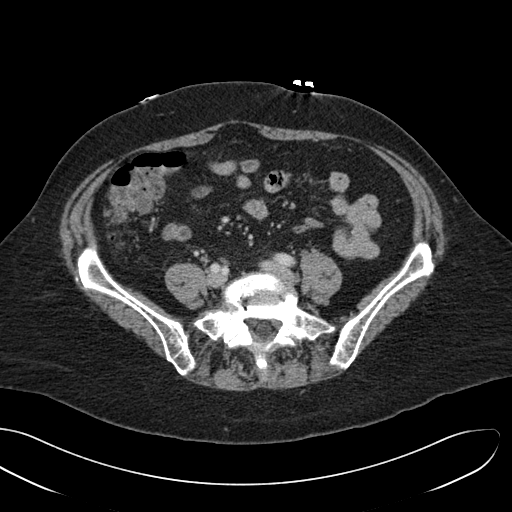
[im 71/141  soft-tissue]
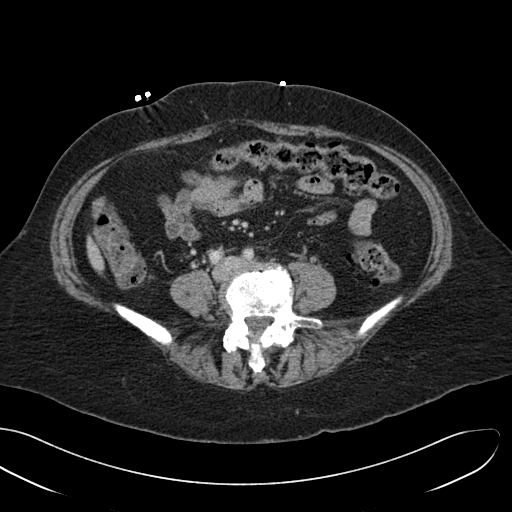
[im 78/141  soft-tissue]
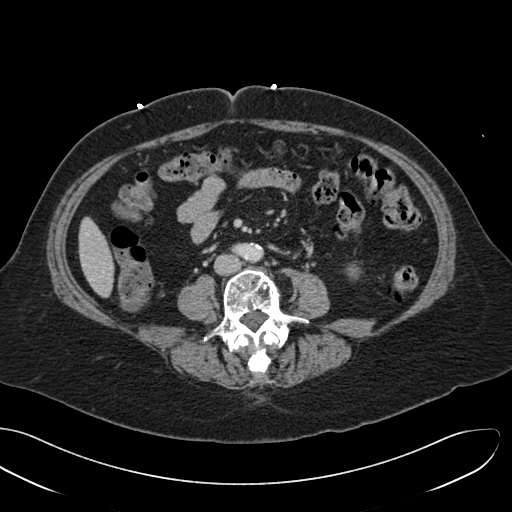
[im 94/141  soft-tissue]
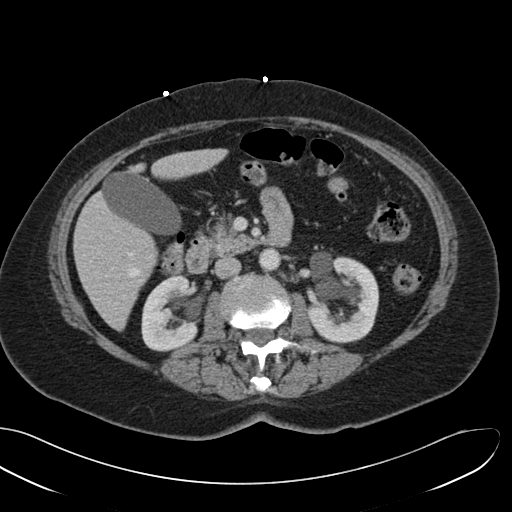
[im 94/141  bone]
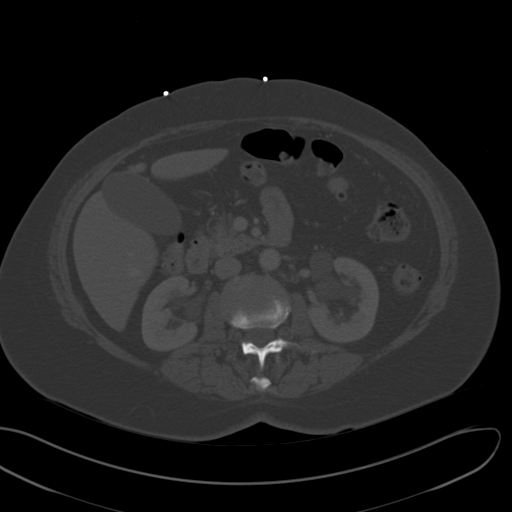
[im 102/141  soft-tissue]
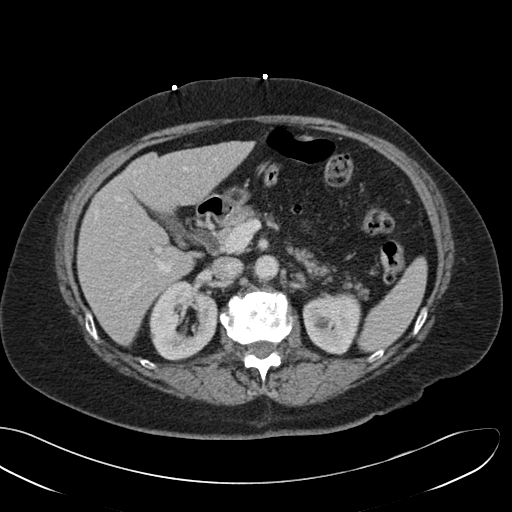
[im 109/141  soft-tissue]
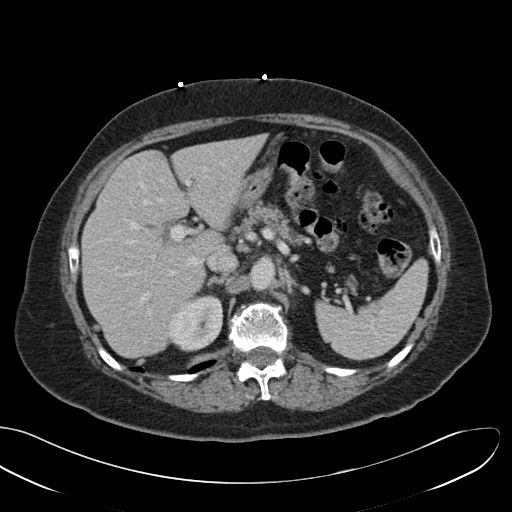
[im 109/141  lung]
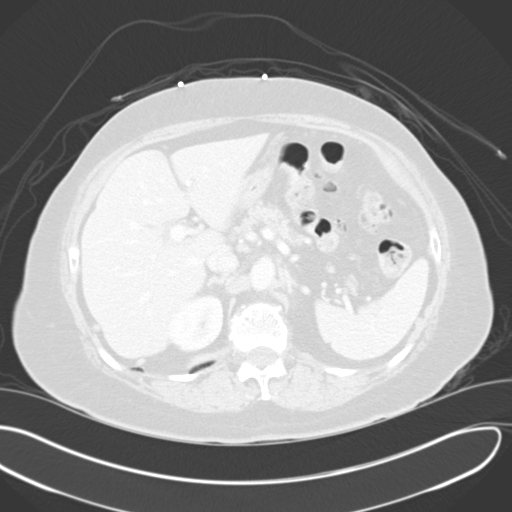
[im 117/141  lung]
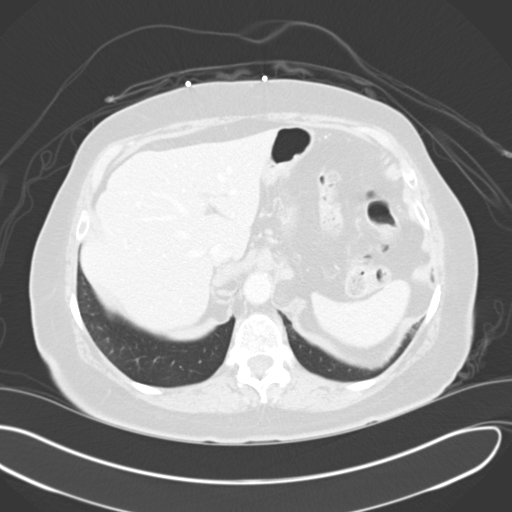
[im 125/141  soft-tissue]
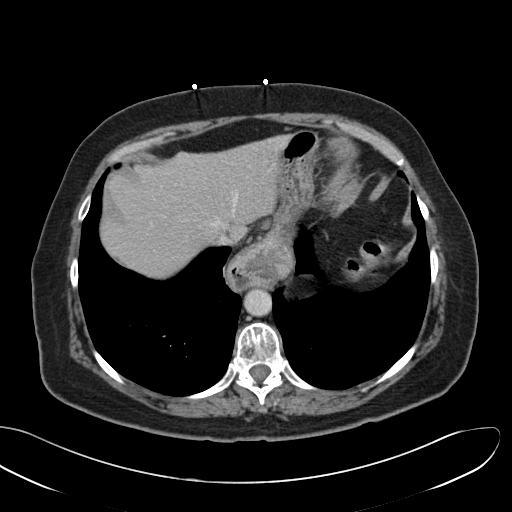
[im 125/141  lung]
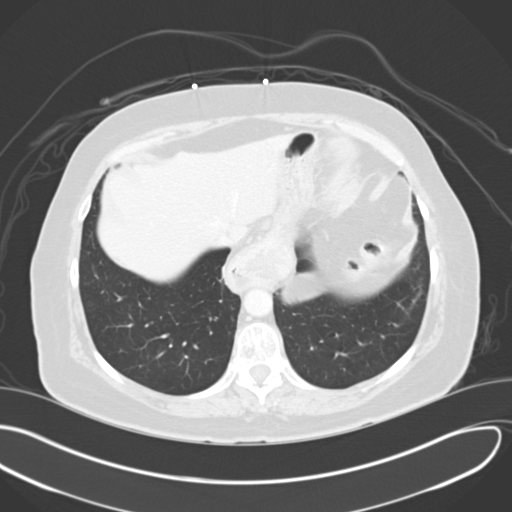
[im 133/141  soft-tissue]
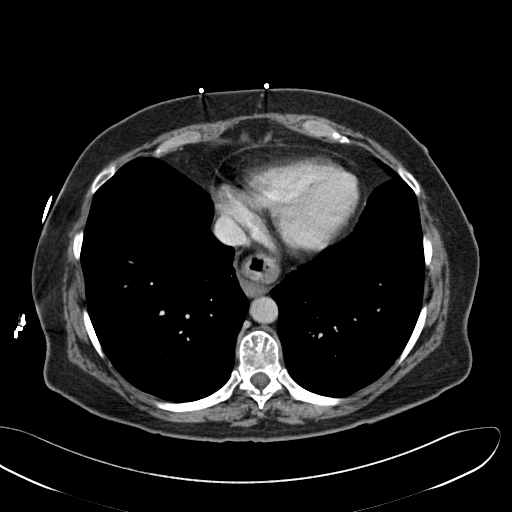
[im 133/141  lung]
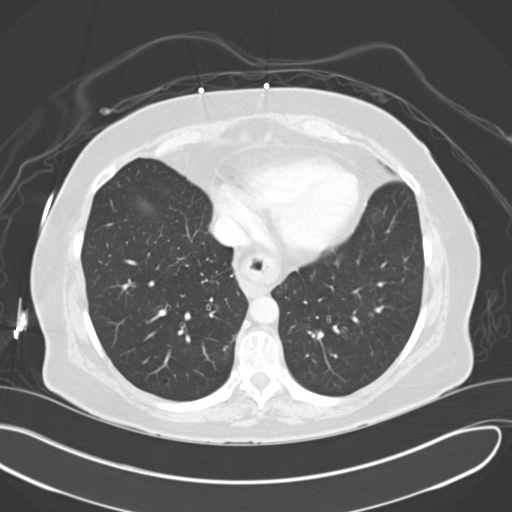

[14 of 32 positions shown; findings below may reference images not displayed]

FINDINGS: There is right middle lobe and lingular scarring. There is no pneumothorax.
The heart size is normal.

The liver demonstrates no focal abnormality. There is no intrahepatic or
extrahepatic biliary ductal dilatation. The gallbladder is unremarkable. The
spleen demonstrates no focal abnormality. There are left parapelvic cysts.
The kidneys, adrenal glands, and pancreas are normal. The bladder is
unremarkable.

There is a moderate-sized hiatal hernia. The duodenum, small intestine, and
large intestine demonstrate no gross abnormality, but evaluation is limited
secondary to lack of enteric contrast. The appendix is dilated measuring 10
mm in diameter with periappendiceal inflammatory changes most consistent
with acute appendicitis. There is no periappendiceal fluid collection. There
is no pneumoperitoneum, pneumatosis, or portal venous gas. There is no
abdominal or pelvic free fluid. There is no lymphadenopathy.

The abdominal aorta is normal in caliber with atherosclerosis.

The osseous structures are unremarkable.
IMPRESSION: 1. Acute appendicitis.

These findings were communicated to Dr. Sakubu on 12/08/2012 at 7686 hours.

[REDACTED]

## 2015-08-25 ENCOUNTER — Other Ambulatory Visit: Payer: Self-pay | Admitting: Physical Medicine and Rehabilitation

## 2015-08-25 DIAGNOSIS — M5416 Radiculopathy, lumbar region: Secondary | ICD-10-CM

## 2015-09-13 ENCOUNTER — Other Ambulatory Visit: Payer: Self-pay | Admitting: Internal Medicine

## 2015-09-13 DIAGNOSIS — Z1231 Encounter for screening mammogram for malignant neoplasm of breast: Secondary | ICD-10-CM

## 2015-09-22 ENCOUNTER — Ambulatory Visit
Admission: RE | Admit: 2015-09-22 | Discharge: 2015-09-22 | Disposition: A | Discharge status: Home / Self Care | Payer: Medicare Other | Source: Ambulatory Visit | Attending: Physical Medicine and Rehabilitation | Admitting: Physical Medicine and Rehabilitation

## 2015-09-22 DIAGNOSIS — M5416 Radiculopathy, lumbar region: Secondary | ICD-10-CM | POA: Diagnosis present

## 2015-09-22 DIAGNOSIS — M4806 Spinal stenosis, lumbar region: Secondary | ICD-10-CM | POA: Insufficient documentation

## 2015-09-22 DIAGNOSIS — M5126 Other intervertebral disc displacement, lumbar region: Secondary | ICD-10-CM | POA: Diagnosis not present

## 2015-09-28 ENCOUNTER — Ambulatory Visit
Admission: RE | Admit: 2015-09-28 | Discharge: 2015-09-28 | Disposition: A | Discharge status: Home / Self Care | Payer: Medicare Other | Source: Ambulatory Visit | Attending: Internal Medicine | Admitting: Internal Medicine

## 2015-09-28 DIAGNOSIS — Z1231 Encounter for screening mammogram for malignant neoplasm of breast: Secondary | ICD-10-CM

## 2016-11-07 ENCOUNTER — Other Ambulatory Visit: Payer: Self-pay | Admitting: Internal Medicine

## 2016-11-07 DIAGNOSIS — Z1231 Encounter for screening mammogram for malignant neoplasm of breast: Secondary | ICD-10-CM

## 2016-11-23 ENCOUNTER — Ambulatory Visit
Admission: RE | Admit: 2016-11-23 | Discharge: 2016-11-23 | Disposition: A | Discharge status: Home / Self Care | Payer: Medicare Other | Source: Ambulatory Visit | Attending: Internal Medicine | Admitting: Internal Medicine

## 2016-11-23 DIAGNOSIS — Z1231 Encounter for screening mammogram for malignant neoplasm of breast: Secondary | ICD-10-CM | POA: Insufficient documentation

## 2016-12-02 ENCOUNTER — Emergency Department
Admission: EM | Admit: 2016-12-02 | Discharge: 2016-12-02 | Disposition: A | Discharge status: Home / Self Care | Payer: Medicare Other | Attending: Emergency Medicine | Admitting: Emergency Medicine

## 2016-12-02 ENCOUNTER — Encounter: Payer: Self-pay | Admitting: Medical Oncology

## 2016-12-02 DIAGNOSIS — Z85828 Personal history of other malignant neoplasm of skin: Secondary | ICD-10-CM | POA: Diagnosis not present

## 2016-12-02 DIAGNOSIS — M542 Cervicalgia: Secondary | ICD-10-CM | POA: Diagnosis present

## 2016-12-02 DIAGNOSIS — W57XXXA Bitten or stung by nonvenomous insect and other nonvenomous arthropods, initial encounter: Secondary | ICD-10-CM | POA: Diagnosis not present

## 2016-12-02 DIAGNOSIS — L03221 Cellulitis of neck: Secondary | ICD-10-CM | POA: Diagnosis not present

## 2016-12-02 DIAGNOSIS — J449 Chronic obstructive pulmonary disease, unspecified: Secondary | ICD-10-CM | POA: Diagnosis not present

## 2016-12-02 DIAGNOSIS — Z87891 Personal history of nicotine dependence: Secondary | ICD-10-CM | POA: Diagnosis not present

## 2016-12-02 DIAGNOSIS — Z7982 Long term (current) use of aspirin: Secondary | ICD-10-CM | POA: Insufficient documentation

## 2016-12-02 DIAGNOSIS — Z79899 Other long term (current) drug therapy: Secondary | ICD-10-CM | POA: Insufficient documentation

## 2016-12-02 MED ORDER — FLUCONAZOLE 100 MG PO TABS: 100.0000 mg | ORAL_TABLET | Freq: Every day | ORAL | 0 refills | Status: AC

## 2016-12-02 MED ORDER — CEFUROXIME AXETIL 500 MG PO TABS: 500.0000 mg | ORAL_TABLET | Freq: Two times a day (BID) | ORAL | 0 refills | Status: AC

## 2016-12-02 NOTE — ED Triage Notes (Signed)
Pt reports bug bite to back of neck that occurred Thursday.

## 2016-12-02 NOTE — ED Provider Notes (Addendum)
[facility] Emergency Department Provider Note   ____________________________________________   I have reviewed the triage vital signs and the nursing notes.   HISTORY  Chief Complaint Insect Bite    HPI Sara Stephenson is a 83 y.o. female since the emergency department with an insect bite along the posterior aspect of the neck with central punctum without drainage erythema and swelling with induration. Patient reports being bitten by mosquitoes followed by itching. She felt the area was clearing up then she noted onset of pain, aching in her neck with low-grade fever. Patient felt she needed to be evaluated. Patient denies finding a tick, being in a wooded area or noting any ticks in the area which she lives. Patient denies any other symptoms. Patient denies fever, chills, headache, vision changes, chest pain, chest tightness, shortness of breath, abdominal pain, nausea and vomiting.  Past Medical History:  Diagnosis Date  . COPD (chronic obstructive pulmonary disease) ([place])   . Kidney stone   . Rib fracture, right, with routine healing, subsequent encounter 05/2010   fall injury    Patient Active Problem List   Diagnosis Date Noted  . Elevated blood pressure 04/18/2012  . COPD (chronic obstructive pulmonary disease) ([place]) 08/08/2011  . DEGENERATIVE JOINT DISEASE, CERVICAL SPINE 06/01/2009  . EPIDERMOID CYST 03/03/2008  . GANGLION CYST, WRIST, RIGHT 03/03/2008  . Overweight (BMI 25.0-29.9) 05/29/2006  . HYPERLIPIDEMIA 05/17/2006  . GASTROESOPHAGEAL REFLUX, NO ESOPHAGITIS 05/17/2006  . ACTINIC DERMATITIS 05/17/2006  . OSTEOARTHRITIS, MULTI SITES 05/17/2006  . OSTEOPENIA 05/17/2006    Past Surgical History:  Procedure Laterality Date  . APPENDECTOMY    . BREAST BIOPSY  1954   left benign  . [place]   right  . SQUAMOUS CELL CARCINOMA EXCISION  2012   right bridge of nose  . TONSILLECTOMY      Prior to Admission  medications   Medication Sig Start Date End Date Taking? Authorizing Provider  Aclidinium Bromide (TUDORZA PRESSAIR) 400 MCG/ACT AEPB Inhale 1 puff into the lungs 2 (two) times daily.    [provider]  albuterol (PROAIR HFA) 108 (90 BASE) MCG/ACT inhaler Inhale 2 puffs into the lungs every 6 (six) hours as needed for wheezing. Patient not taking: Reported on 01/21/2015 04/18/12   Candelaria Celeste, MD  aspirin 81 MG tablet Take 81 mg by mouth daily.    Candelaria Celeste, MD  Biotin (BIOTIN MAXIMUM STRENGTH) 10 MG TABS Take 1 tablet by mouth daily.    [provider]  Calcium Carbonate-Vitamin D (CALTRATE 600+D) 600-400 MG-UNIT per tablet Take 1 tablet by mouth 2 (two) times daily.      [provider]  cefUROXime (CEFTIN) 500 MG tablet Take 1 tablet (500 mg total) by mouth 2 (two) times daily with a meal. 12/02/16 12/12/16  Raelynn Corron M, PA-C  Cyanocobalamin (B-12) 500 MCG TABS Take 500 mcg by mouth daily after breakfast.    [provider]  fluconazole (DIFLUCAN) 100 MG tablet Take 1 tablet (100 mg total) by mouth daily. 12/12/16 12/14/16  Theressa Piedra M, PA-C  ibuprofen (ADVIL,MOTRIN) 200 MG tablet Take 200 mg by mouth 2 (two) times daily.     [provider]  oxyCODONE-acetaminophen (PERCOCET/ROXICET) 5-325 MG tablet Take 1 tablet by mouth every 6 (six) hours as needed for severe pain. Patient not taking: Reported on 02/01/2015 01/13/15   Eula Listen, MD    Allergies Tramadol; Sulfonamide derivatives; Hydrocodone; Levofloxacin; Pneumococcal vaccine polyvalent; Codeine; and Spiriva [  tiotropium bromide monohydrate]  Family History  Problem Relation Age of Onset  . Diabetes Maternal Grandmother   . Breast cancer Neg Hx     Social History Social History  Substance Use Topics  . Smoking status: Former Smoker    Packs/day: 0.50    Years: 33.00    Types: Cigarettes  . Smokeless tobacco: Not on file  . Alcohol use No    Review of  Systems Constitutional: Negative for fever/chills Eyes: No visual changes. ENT:  Negative for sore throat and for difficulty swallowing Cardiovascular: Denies chest pain. Respiratory: Denies cough. Denies shortness of breath. Gastrointestinal: No abdominal pain.  No nausea, vomiting, diarrhea. Genitourinary: Negative for dysuria. Musculoskeletal: Negative for back pain. Skin: Negative for rash. Insect bite wound to the back the  neck. Neurological: Negative for headaches.  ____________________________________________   PHYSICAL EXAM:  VITAL SIGNS: ED Triage Vitals  Enc Vitals Group     BP 12/02/16 0939 133/75     Pulse Rate 12/02/16 0939 (!) 118     Resp 12/02/16 0939 18     Temp 12/02/16 0939 98.4 F (36.9 C)     Temp Source 12/02/16 0939 Oral     SpO2 12/02/16 0939 95 %     Weight 12/02/16 0939 176 lb (79.8 kg)     Height --      Head Circumference --      Peak Flow --      Pain Score 12/02/16 0937 8     Pain Loc --      Pain Edu? --      Excl. in [place]? --     Constitutional: Alert and oriented. Well appearing and in no acute distress.  Eyes: Conjunctivae are normal. PERRL. EOMI  Head: Normocephalic and atraumatic. ENT:      Ears: Canals clear. TMs intact bilaterally.      Nose: No congestion/rhinnorhea.      Mouth/Throat: Mucous membranes are moist.  Neck:Supple. No thyromegaly. No stridor.  Cardiovascular: Normal rate, regular rhythm. Normal S1 and S2.  Good peripheral circulation. Respiratory: Normal respiratory effort without tachypnea or retractions. Lungs CTAB. No wheezes/rales/rhonchi.  Hematological/Lymphatic/Immunological: No cervical lymphadenopathy. Cardiovascular: Normal rate, regular rhythm. Normal distal pulses. Gastrointestinal: Bowel sounds 4 quadrants. Soft and nontender to palpation.  No CVA tenderness. Musculoskeletal: Nontender with normal range of motion in all extremities. Neurologic: Normal speech and language.  Skin:  Skin is warm, dry and  intact. No rash noted. Insect bite wound to the back the  Neck. Central pedunculate without drainage/opening with ~4 cm diameter erythema, swelling with induration.  Psychiatric: Mood and affect are normal. Speech and behavior are normal. Patient exhibits appropriate insight and judgement.  ____________________________________________   LABS (all labs ordered are listed, but only abnormal results are displayed)  Labs Reviewed - No data to display ____________________________________________  EKG none ____________________________________________  RADIOLOGY none ____________________________________________   PROCEDURES  Procedure(s) performed: no    Critical Care performed: no ____________________________________________   INITIAL IMPRESSION / ASSESSMENT AND PLAN / ED COURSE  Pertinent labs & imaging results that were available during my care of the patient were reviewed by me and considered in my medical decision making (see chart for details).  Patient presents to emergency department with insect bite wound to the posterior neck. History and physical exam findings are reassuring symptoms are consistent with secondary skin infection and cellulitis associated with insect bite wound. Patient will be prescribed Ceftin for antibiotic coverage. Recommended patient continue to monitor the wound  area and if she noted any worsening symptoms to follow up with her primary care or return to the emergency department. Also recommended she continue ibuprofen or Advil for pain and fever. Patient informed of clinical course, understand medical decision-making process, and agree with plan.   ____________________________________________   FINAL CLINICAL IMPRESSION(S) / ED DIAGNOSES  Final diagnoses:  Insect bite, initial encounter  Cellulitis of neck       NEW MEDICATIONS STARTED DURING THIS VISIT:  Discharge Medication List as of 12/02/2016 10:36 AM    START taking these  medications   Details  cefUROXime (CEFTIN) 500 MG tablet Take 1 tablet (500 mg total) by mouth 2 (two) times daily with a meal., Starting Sat 12/02/2016, Until Tue 12/12/2016, Print    fluconazole (DIFLUCAN) 100 MG tablet Take 1 tablet (100 mg total) by mouth daily., Starting Tue 12/12/2016, Until Thu 12/14/2016, Print         Note:  This document was prepared using Dragon voice recognition software and may include unintentional dictation errors.    Jerolyn Shin, PA-C 12/02/16 1145    Jerolyn Shin, PA-C 12/02/16 1315    Nena Polio, MD 12/02/16 [phone]

## 2016-12-02 NOTE — Discharge Instructions (Signed)
Take medication as prescribed. Return to emergency department if symptoms worsen and follow-up with PCP as needed.    Continue to monitor the insect bite wound area for improvement of symptoms. If you notice worsening symptoms do not hesitate to return to the emergency department.

## 2017-04-09 ENCOUNTER — Other Ambulatory Visit (HOSPITAL_COMMUNITY): Payer: Self-pay | Admitting: Surgery

## 2017-04-09 DIAGNOSIS — M75111 Incomplete rotator cuff tear or rupture of right shoulder, not specified as traumatic: Secondary | ICD-10-CM

## 2017-04-09 DIAGNOSIS — M7581 Other shoulder lesions, right shoulder: Secondary | ICD-10-CM

## 2017-04-11 ENCOUNTER — Other Ambulatory Visit: Payer: Self-pay | Admitting: Surgery

## 2017-04-11 DIAGNOSIS — M75111 Incomplete rotator cuff tear or rupture of right shoulder, not specified as traumatic: Secondary | ICD-10-CM

## 2017-04-11 DIAGNOSIS — M7581 Other shoulder lesions, right shoulder: Secondary | ICD-10-CM

## 2017-04-18 ENCOUNTER — Ambulatory Visit
Admission: RE | Admit: 2017-04-18 | Discharge: 2017-04-18 | Disposition: A | Discharge status: Home / Self Care | Payer: Medicare Other | Source: Ambulatory Visit | Attending: Surgery | Admitting: Surgery

## 2017-04-18 DIAGNOSIS — M7581 Other shoulder lesions, right shoulder: Secondary | ICD-10-CM

## 2017-04-18 DIAGNOSIS — M75111 Incomplete rotator cuff tear or rupture of right shoulder, not specified as traumatic: Secondary | ICD-10-CM | POA: Insufficient documentation

## 2017-05-10 ENCOUNTER — Other Ambulatory Visit: Payer: Self-pay

## 2017-05-10 ENCOUNTER — Encounter
Admission: RE | Admit: 2017-05-10 | Discharge: 2017-05-10 | Disposition: A | Discharge status: Home / Self Care | Payer: Medicare Other | Source: Ambulatory Visit | Attending: Surgery | Admitting: Surgery

## 2017-05-10 DIAGNOSIS — Z01812 Encounter for preprocedural laboratory examination: Secondary | ICD-10-CM | POA: Diagnosis present

## 2017-05-10 DIAGNOSIS — I498 Other specified cardiac arrhythmias: Secondary | ICD-10-CM | POA: Diagnosis not present

## 2017-05-10 DIAGNOSIS — Z01810 Encounter for preprocedural cardiovascular examination: Secondary | ICD-10-CM | POA: Insufficient documentation

## 2017-05-10 HISTORY — DX: Malignant (primary) neoplasm, unspecified: C80.1

## 2017-05-10 HISTORY — DX: Unspecified osteoarthritis, unspecified site: M19.90

## 2017-05-10 LAB — TYPE AND SCREEN
ABO/RH(D): O POS
Antibody Screen: NEGATIVE

## 2017-05-10 LAB — BASIC METABOLIC PANEL
Anion gap: 10 (ref 5–15)
BUN: 23 mg/dL — ABNORMAL HIGH (ref 6–20)
CALCIUM: 9.4 mg/dL (ref 8.9–10.3)
CO2: 25 mmol/L (ref 22–32)
Chloride: 107 mmol/L (ref 101–111)
Creatinine, Ser: 0.78 mg/dL (ref 0.44–1.00)
GFR calc Af Amer: >60 mL/min (ref >60)
GLUCOSE: 87 mg/dL (ref 65–99)
Potassium: 3.9 mmol/L (ref 3.5–5.1)
Sodium: 142 mmol/L (ref 135–145)

## 2017-05-10 LAB — CBC
HCT: 41.4 % (ref 35.0–47.0)
Hemoglobin: 13.5 g/dL (ref 12.0–16.0)
MCH: 28.3 pg (ref 26.0–34.0)
MCHC: 32.5 g/dL (ref 32.0–36.0)
MCV: 87.1 fL (ref 80.0–100.0)
PLATELETS: 212 10*3/uL (ref 150–440)
RBC: 4.76 MIL/uL (ref 3.80–5.20)
RDW: 15.1 % — ABNORMAL HIGH (ref 11.5–14.5)
WBC: 6.3 10*3/uL (ref 3.6–11.0)

## 2017-05-10 LAB — URINALYSIS, COMPLETE (UACMP) WITH MICROSCOPIC
Bacteria, UA: NONE SEEN
Bilirubin Urine: NEGATIVE
Glucose, UA: NEGATIVE mg/dL
Hgb urine dipstick: NEGATIVE
Ketones, ur: NEGATIVE mg/dL
Leukocytes, UA: NEGATIVE
Nitrite: NEGATIVE
PROTEIN: NEGATIVE mg/dL
RBC / HPF: NONE SEEN RBC/hpf (ref 0–5)
Specific Gravity, Urine: 1.019 (ref 1.005–1.030)
pH: 6 (ref 5.0–8.0)

## 2017-05-10 LAB — SURGICAL PCR SCREEN
MRSA, PCR: NEGATIVE
Staphylococcus aureus: NEGATIVE

## 2017-05-10 LAB — PROTIME-INR
INR: 0.93
Prothrombin Time: 12.4 seconds (ref 11.4–15.2)

## 2017-05-10 NOTE — Pre-Procedure Instructions (Signed)
Abnormal urinalysis faxed to Dr Nicholaus Bloom office.

## 2017-05-10 NOTE — Patient Instructions (Signed)
Your procedure is scheduled on: May 22, 2017 TUESDAY Report to Same Day Surgery on the 2nd floor in the [facility]. To find out your arrival time, please call [phone] between 1PM - 3PM on: MARCH 4, MONDAY  REMEMBER: Instructions that are not followed completely may result in serious medical risk, up to and including death; or upon the discretion of your surgeon and anesthesiologist your surgery may need to be rescheduled.  Do not eat food after midnight the night before your procedure.  No gum chewing or hard candies.  You may however, drink CLEAR liquids up to 2 hours before you are scheduled to arrive at the hospital for your procedure.  Do not drink clear liquids within 2 hours of the start of your surgery.  Clear liquids include: - water  - apple juice without pulp - clear gatorade - black coffee or tea (Do NOT add anything to the coffee or tea) Do NOT drink anything that is not on this list.  No Smoking including e-cigarettes for 24 hours prior to surgery. No chewable tobacco products for at least 6 hours prior to surgery. No nicotine patches on the day of surgery.  On the morning of surgery brush your teeth with toothpaste and water, you may rinse your mouth with mouthwash if you wish. Do not swallow any  toothpaste or mouthwash.  Notify your doctor if there is any change in your medical condition (cold, fever, infection).  Do not wear jewelry, make-up, hairpins, clips or nail polish.  Do not wear lotions, powders, or perfumes. You may NOT wear deodorant.  Do not shave 48 hours prior to surgery. Men may shave face and neck.  Contacts and dentures may not be worn into surgery.  Do not bring valuables to the hospital. [facility] is not responsible for any belongings or valuables.   TAKE THESE MEDICATIONS THE MORNING OF SURGERY WITH A SIP OF WATER:    Use CHG Soap  as directed on instruction sheet.  Use inhalers on the day of surgery and bring to the  hospital.  Follow recommendations from Cardiologist, Pulmonologist or PCP regarding stopping Aspirin, Coumadin, Plavix, Eliquis, Pradaxa, or Pletal. FOR  10 DAYS BEFORE SURGERY   Stop Anti-inflammatories  10 DAYS  BEFORE SURGERY - NONE AFTER May 12, 2017 such as Advil, Aleve, Ibuprofen, Motrin, Naproxen, Naprosyn, Goodie powder, or aspirin products. (May take Tylenol or Acetaminophen if needed.)  Stop ANY OVER THE COUNTER   10 DAYS BEFORE SURGERY(RED YEAST RICE) supplements until after surgery. (May continue Vitamin D, Vitamin B, and multivitamin.)  If you are being admitted to the hospital overnight, leave your suitcase in the car. After surgery it may be brought to your room.  If you are being discharged the day of surgery, you will not be allowed to drive home. You will need someone to drive you home and stay with you that night.   If you are taking public transportation, you will need to have a responsible adult to with you.  Please call the number above if you have any questions about these instructions.

## 2017-05-11 LAB — URINE CULTURE: Culture: NO GROWTH

## 2017-05-14 IMAGING — CR DG LUMBAR SPINE COMPLETE 4+V
1 series · 5 of 5 positions shown · non-contrast
Comparison: Axial CT images from a scan of the abdomen and pelvis
dated 08 December, 2012

CLINICAL DATA: Two weeks of low back pain with worsening symptoms
following bending over yesterday, pain is centered in the right SI
joint region with some extension into the right leg, history of
bilateral hip bursitis treated with injection 2 years ago

EXAM:
LUMBAR SPINE - COMPLETE 4+ VIEW

[Series 1: ap · 0.17mm/px · 5 of 5 slices shown]
[im 1/5]
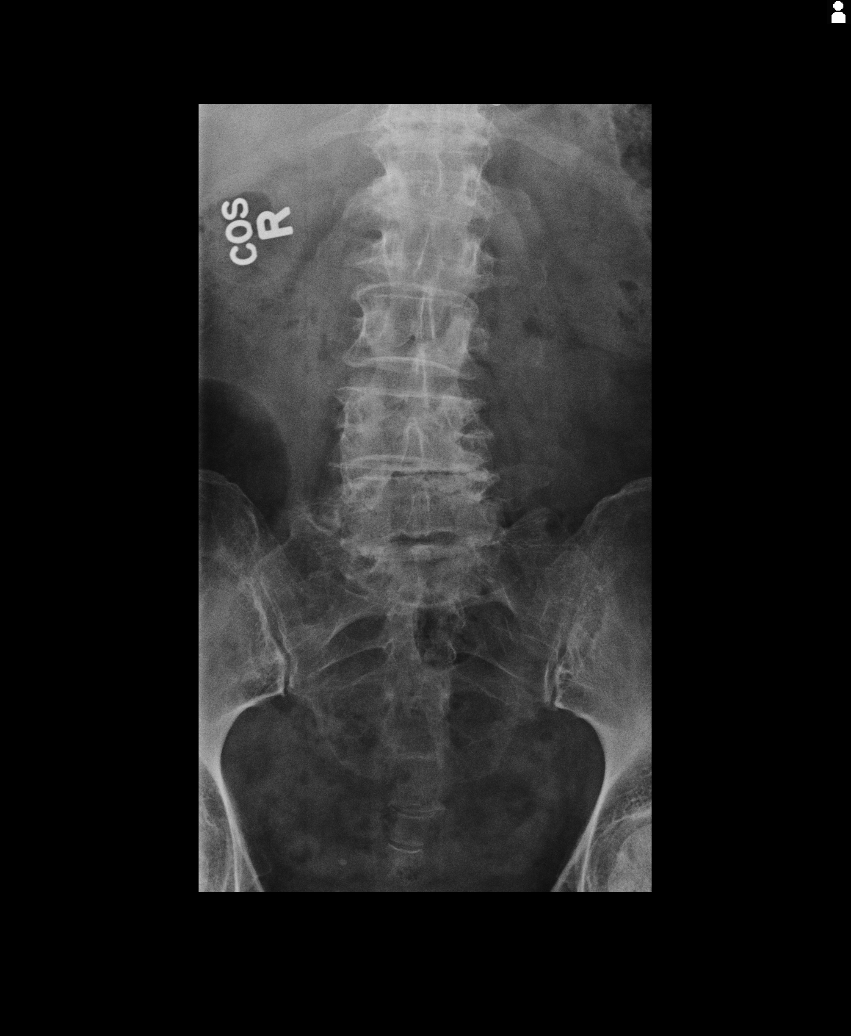
[im 2/5]
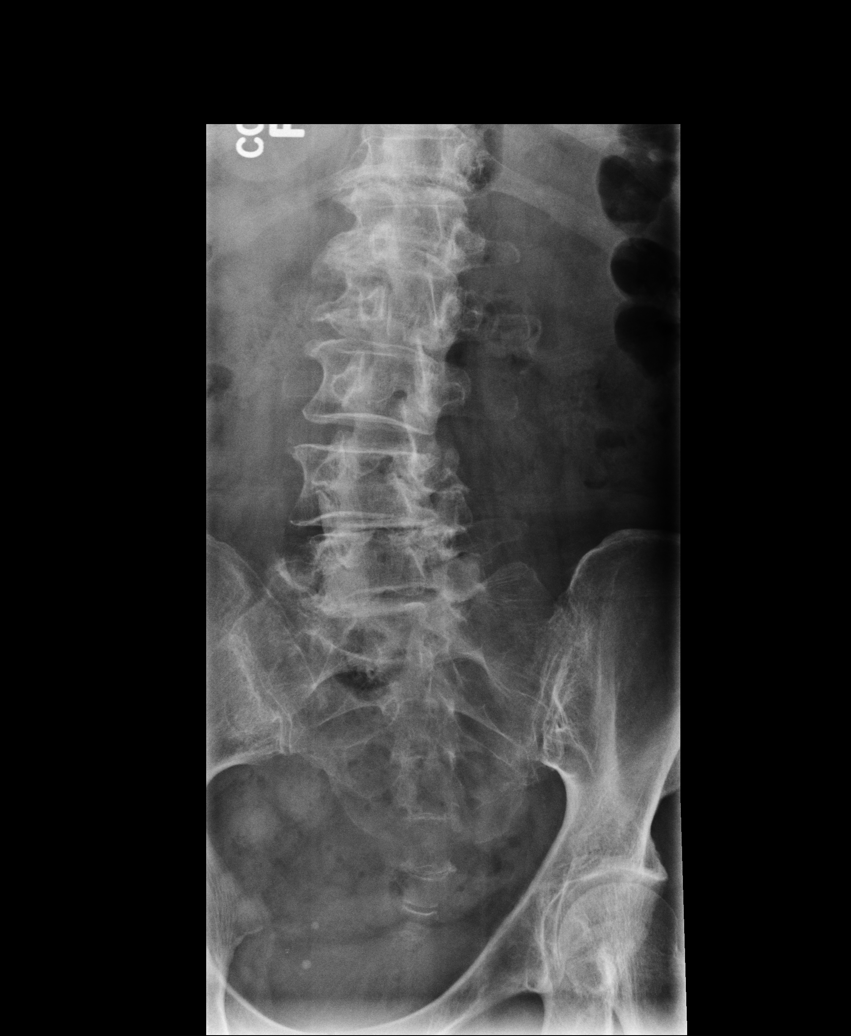
[im 3/5]
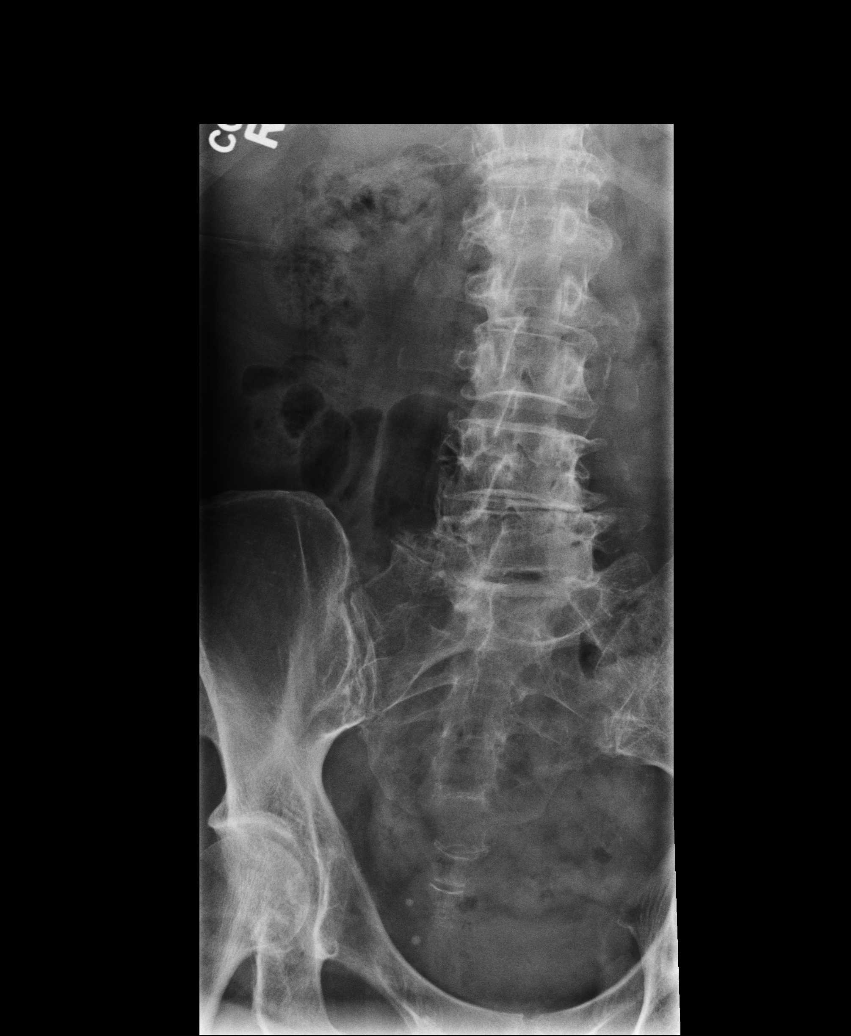
[im 4/5]
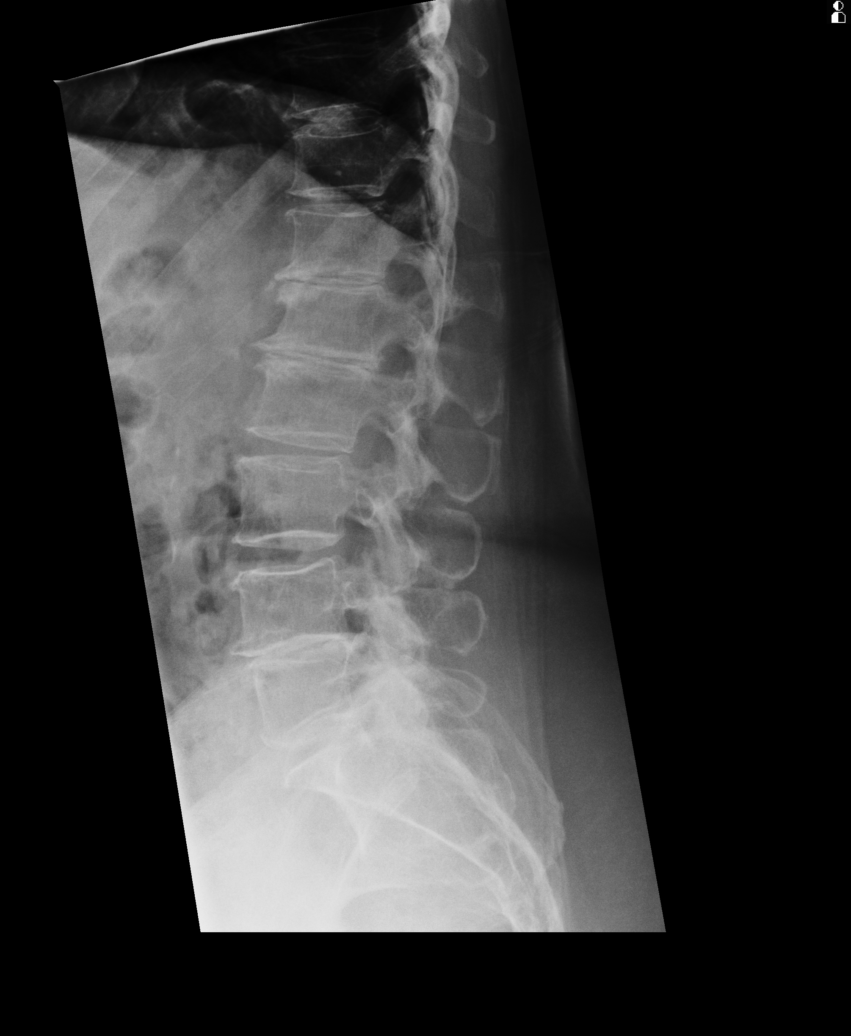
[im 5/5]
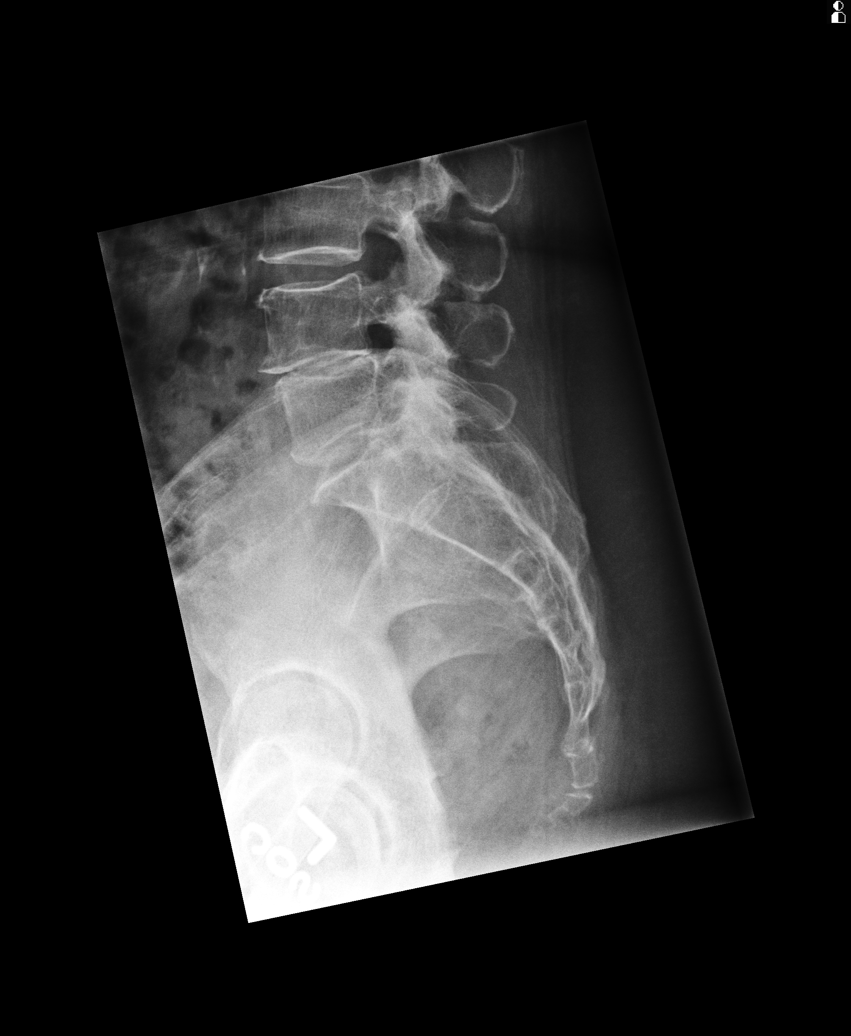

[5 of 5 positions shown; findings below may reference images not displayed]

FINDINGS: The lumbar vertebral bodies are preserved in height. There is gentle
curvature convex toward the right centered at L3-4. There is
moderate disc space narrowing at T12-L1, L1-2, and L4-L5. There is
no spondylolisthesis. There are hyper trophic changes of the facet
joints at L4-5 and L5-S1. The pedicles and transverse processes are
intact. The observed portions of the sacrum are normal.
IMPRESSION: There is moderate multilevel degenerative disc and facet joint
change. There is no compression fracture nor other acute bony
abnormality.

## 2017-05-22 ENCOUNTER — Encounter: Admission: RE | Payer: Self-pay | Source: Ambulatory Visit

## 2017-05-22 ENCOUNTER — Inpatient Hospital Stay: Admission: RE | Admit: 2017-05-22 | Payer: Medicare Other | Source: Ambulatory Visit | Admitting: Surgery

## 2017-05-22 SURGERY — ARTHROPLASTY, SHOULDER, TOTAL, REVERSE
Anesthesia: Choice | Laterality: Right

## 2017-07-25 ENCOUNTER — Other Ambulatory Visit: Payer: Self-pay | Admitting: Internal Medicine

## 2017-07-25 DIAGNOSIS — R1010 Upper abdominal pain, unspecified: Secondary | ICD-10-CM

## 2017-07-30 ENCOUNTER — Ambulatory Visit
Admission: RE | Admit: 2017-07-30 | Discharge: 2017-07-30 | Disposition: A | Discharge status: Home / Self Care | Payer: Medicare Other | Source: Ambulatory Visit | Attending: Internal Medicine | Admitting: Internal Medicine

## 2017-07-30 DIAGNOSIS — R1010 Upper abdominal pain, unspecified: Secondary | ICD-10-CM | POA: Insufficient documentation

## 2017-11-29 ENCOUNTER — Other Ambulatory Visit: Payer: Self-pay

## 2017-11-29 ENCOUNTER — Encounter: Payer: Self-pay | Admitting: *Deleted

## 2017-11-29 NOTE — Discharge Instructions (Signed)

## 2017-11-30 ENCOUNTER — Encounter: Payer: Self-pay | Admitting: Anesthesiology

## 2017-12-05 ENCOUNTER — Ambulatory Visit
Admission: RE | Admit: 2017-12-05 | Discharge: 2017-12-05 | Disposition: A | Discharge status: Home / Self Care | Payer: Medicare Other | Source: Ambulatory Visit | Attending: Ophthalmology | Admitting: Ophthalmology

## 2017-12-05 ENCOUNTER — Ambulatory Visit: Payer: Medicare Other | Admitting: Anesthesiology

## 2017-12-05 ENCOUNTER — Encounter
Admission: RE | Disposition: A | Discharge status: Home / Self Care | Payer: Self-pay | Source: Ambulatory Visit | Attending: Ophthalmology

## 2017-12-05 DIAGNOSIS — Z85828 Personal history of other malignant neoplasm of skin: Secondary | ICD-10-CM | POA: Diagnosis not present

## 2017-12-05 DIAGNOSIS — H2512 Age-related nuclear cataract, left eye: Secondary | ICD-10-CM | POA: Diagnosis not present

## 2017-12-05 DIAGNOSIS — Z79899 Other long term (current) drug therapy: Secondary | ICD-10-CM | POA: Insufficient documentation

## 2017-12-05 DIAGNOSIS — Z87891 Personal history of nicotine dependence: Secondary | ICD-10-CM | POA: Insufficient documentation

## 2017-12-05 DIAGNOSIS — J449 Chronic obstructive pulmonary disease, unspecified: Secondary | ICD-10-CM | POA: Diagnosis not present

## 2017-12-05 DIAGNOSIS — Z7982 Long term (current) use of aspirin: Secondary | ICD-10-CM | POA: Diagnosis not present

## 2017-12-05 HISTORY — PX: CATARACT EXTRACTION W/PHACO: SHX586

## 2017-12-05 SURGERY — PHACOEMULSIFICATION, CATARACT, WITH IOL INSERTION
Anesthesia: Monitor Anesthesia Care | Laterality: Left

## 2017-12-05 MED ORDER — ONDANSETRON HCL 4 MG/2ML IJ SOLN: 4.0000 mg | Freq: Once | INTRAMUSCULAR | Status: DC | PRN

## 2017-12-05 MED ORDER — POLYMYXIN B-TRIMETHOPRIM 10000-0.1 UNIT/ML-% OP SOLN
1.0000 [drp] | OPHTHALMIC | Status: DC | PRN
  Administered 2017-12-05 (×3): 1 [drp] via OPHTHALMIC

## 2017-12-05 MED ORDER — NA HYALUR & NA CHOND-NA HYALUR 0.4-0.35 ML IO KIT
PACK | INTRAOCULAR | Status: DC | PRN
  Administered 2017-12-05: 1 mL via INTRAOCULAR

## 2017-12-05 MED ORDER — LACTATED RINGERS IV SOLN: 10.0000 mL/h | INTRAVENOUS | Status: DC

## 2017-12-05 MED ORDER — BRIMONIDINE TARTRATE-TIMOLOL 0.2-0.5 % OP SOLN
OPHTHALMIC | Status: DC | PRN
  Administered 2017-12-05: 1 [drp] via OPHTHALMIC

## 2017-12-05 MED ORDER — FENTANYL CITRATE (PF) 100 MCG/2ML IJ SOLN
INTRAMUSCULAR | Status: DC | PRN
  Administered 2017-12-05: 50 ug via INTRAVENOUS

## 2017-12-05 MED ORDER — ARMC OPHTHALMIC DILATING DROPS
1.0000 "application " | OPHTHALMIC | Status: DC | PRN
  Administered 2017-12-05 (×3): 1 via OPHTHALMIC

## 2017-12-05 MED ORDER — EPINEPHRINE PF 1 MG/ML IJ SOLN
INTRAOCULAR | Status: DC | PRN
  Administered 2017-12-05: 67 mL via OPHTHALMIC

## 2017-12-05 MED ORDER — MIDAZOLAM HCL 2 MG/2ML IJ SOLN
INTRAMUSCULAR | Status: DC | PRN
  Administered 2017-12-05: 1 mg via INTRAVENOUS

## 2017-12-05 MED ORDER — LIDOCAINE HCL (PF) 2 % IJ SOLN
INTRAOCULAR | Status: DC | PRN
  Administered 2017-12-05: 1 mL

## 2017-12-05 MED ORDER — CEFUROXIME OPHTHALMIC INJECTION 1 MG/0.1 ML
INJECTION | OPHTHALMIC | Status: DC | PRN
  Administered 2017-12-05: 0.1 mL via INTRACAMERAL

## 2017-12-05 SURGICAL SUPPLY — 20 items
CANNULA ANT/CHMB 27G (MISCELLANEOUS) ×1 IMPLANT
CANNULA ANT/CHMB 27GA (MISCELLANEOUS) ×3 IMPLANT
GLOVE SURG LX 7.5 STRW (GLOVE) ×2
GLOVE SURG LX STRL 7.5 STRW (GLOVE) ×1 IMPLANT
GLOVE SURG TRIUMPH 8.0 PF LTX (GLOVE) ×3 IMPLANT
GOWN STRL REUS W/ TWL LRG LVL3 (GOWN DISPOSABLE) ×2 IMPLANT
GOWN STRL REUS W/TWL LRG LVL3 (GOWN DISPOSABLE) ×4
LENS IOL TECNIS ITEC 20.0 (Intraocular Lens) ×2 IMPLANT
MARKER SKIN DUAL TIP RULER LAB (MISCELLANEOUS) ×3 IMPLANT
NDL FILTER BLUNT 18X1 1/2 (NEEDLE) ×1 IMPLANT
NEEDLE FILTER BLUNT 18X 1/2SAF (NEEDLE) ×2
NEEDLE FILTER BLUNT 18X1 1/2 (NEEDLE) ×1 IMPLANT
PACK CATARACT BRASINGTON (MISCELLANEOUS) ×3 IMPLANT
PACK EYE AFTER SURG (MISCELLANEOUS) ×3 IMPLANT
PACK OPTHALMIC (MISCELLANEOUS) ×3 IMPLANT
SYR 3ML LL SCALE MARK (SYRINGE) ×3 IMPLANT
SYR 5ML LL (SYRINGE) ×3 IMPLANT
SYR TB 1ML LUER SLIP (SYRINGE) ×3 IMPLANT
WATER STERILE IRR 500ML POUR (IV SOLUTION) ×3 IMPLANT
WIPE NON LINTING 3.25X3.25 (MISCELLANEOUS) ×3 IMPLANT

## 2017-12-05 NOTE — Anesthesia Postprocedure Evaluation (Signed)
Anesthesia Post Note  Patient: Sara Stephenson  Procedure(s) Performed: CATARACT EXTRACTION PHACO AND INTRAOCULAR LENS PLACEMENT (IOC) LEFT (Left )  Patient location during evaluation: PACU Anesthesia Type: MAC Level of consciousness: awake and alert and oriented Pain management: satisfactory to patient Vital Signs Assessment: post-procedure vital signs reviewed and stable Respiratory status: spontaneous breathing, nonlabored ventilation and respiratory function stable Cardiovascular status: blood pressure returned to baseline and stable Postop Assessment: Adequate PO intake and No signs of nausea or vomiting Anesthetic complications: no    Raliegh Ip

## 2017-12-05 NOTE — H&P (Signed)
The History and Physical notes are on paper, have been signed, and are to be scanned. The patient remains stable and unchanged from the H&P.   Previous H&P reviewed, patient examined, and there are no changes.  Sara Stephenson 12/05/2017 10:53 AM   

## 2017-12-05 NOTE — Anesthesia Preprocedure Evaluation (Signed)
Anesthesia Evaluation  Patient identified by MRN, date of birth, ID band Patient awake    Reviewed: Allergy & Precautions, H&P , NPO status , Patient's Chart, lab work & pertinent test results  Airway Mallampati: II  TM Distance: >3 FB Neck ROM: full    Dental no notable dental hx.    Pulmonary COPD, former smoker,    Pulmonary exam normal breath sounds clear to auscultation       Cardiovascular Normal cardiovascular exam Rhythm:regular Rate:Normal     Neuro/Psych    GI/Hepatic GERD  ,  Endo/Other    Renal/GU      Musculoskeletal   Abdominal   Peds  Hematology   Anesthesia Other Findings   Reproductive/Obstetrics                             Anesthesia Physical Anesthesia Plan  ASA: II  Anesthesia Plan: MAC   Post-op Pain Management:    Induction:   PONV Risk Score and Plan: 2 and Midazolam and Treatment may vary due to age or medical condition  Airway Management Planned:   Additional Equipment:   Intra-op Plan:   Post-operative Plan:   Informed Consent: I have reviewed the patients History and Physical, chart, labs and discussed the procedure including the risks, benefits and alternatives for the proposed anesthesia with the patient or authorized representative who has indicated his/her understanding and acceptance.     Plan Discussed with: CRNA  Anesthesia Plan Comments:         Anesthesia Quick Evaluation

## 2017-12-05 NOTE — Op Note (Signed)
OPERATIVE NOTE  Sara Stephenson [id number] 12/05/2017   PREOPERATIVE DIAGNOSIS:  Nuclear sclerotic cataract left eye. H25.12   POSTOPERATIVE DIAGNOSIS:    Nuclear sclerotic cataract left eye.     PROCEDURE:  Phacoemusification with posterior chamber intraocular lens placement of the left eye   LENS:   Implant Name Type Inv. Item Serial No. Manufacturer Lot No. LRB No. Used  LENS IOL DIOP 20.0 - [id number] Intraocular Lens LENS IOL DIOP 20.0 [id number] AMO  Left 1        ULTRASOUND TIME: 13  % of 1 minutes 0 seconds, CDE 8.9  SURGEON:  Wyonia Hough, MD   ANESTHESIA:  Topical with tetracaine drops and 2% Xylocaine jelly, augmented with 1% preservative-free intracameral lidocaine.    COMPLICATIONS:  None.   DESCRIPTION OF PROCEDURE:  The patient was identified in the holding room and transported to the operating room and placed in the supine position under the operating microscope.  The left eye was identified as the operative eye and it was prepped and draped in the usual sterile ophthalmic fashion.   A 1 millimeter clear-corneal paracentesis was made at the 1:30 position.  0.5 ml of preservative-free 1% lidocaine was injected into the anterior chamber.  The anterior chamber was filled with Viscoat viscoelastic.  A 2.4 millimeter keratome was used to make a near-clear corneal incision at the 10:30 position.  .  A curvilinear capsulorrhexis was made with a cystotome and capsulorrhexis forceps.  Balanced salt solution was used to hydrodissect and hydrodelineate the nucleus.   Phacoemulsification was then used in stop and chop fashion to remove the lens nucleus and epinucleus.  The remaining cortex was then removed using the irrigation and aspiration handpiece. Provisc was then placed into the capsular bag to distend it for lens placement.  A lens was then injected into the capsular bag.  The remaining viscoelastic was aspirated.   Wounds were hydrated with balanced salt solution.   The anterior chamber was inflated to a physiologic pressure with balanced salt solution.  No wound leaks were noted. Cefuroxime 0.1 ml of a 10mg /ml solution was injected into the anterior chamber for a dose of 1 mg of intracameral antibiotic at the completion of the case.   Timolol and Brimonidine drops were applied to the eye.  The patient was taken to the recovery room in stable condition without complications of anesthesia or surgery.  [name] 12/05/2017, 11:42 AM

## 2017-12-05 NOTE — Transfer of Care (Signed)
Immediate Anesthesia Transfer of Care Note  Patient: Sara Stephenson  Procedure(s) Performed: CATARACT EXTRACTION PHACO AND INTRAOCULAR LENS PLACEMENT (IOC) LEFT (Left )  Patient Location: PACU  Anesthesia Type: MAC  Level of Consciousness: awake, alert  and patient cooperative  Airway and Oxygen Therapy: Patient Spontanous Breathing and Patient connected to supplemental oxygen  Post-op Assessment: Post-op Vital signs reviewed, Patient's Cardiovascular Status Stable, Respiratory Function Stable, Patent Airway and No signs of Nausea or vomiting  Post-op Vital Signs: Reviewed and stable  Complications: No apparent anesthesia complications

## 2017-12-06 ENCOUNTER — Encounter: Payer: Self-pay | Admitting: Ophthalmology

## 2017-12-24 ENCOUNTER — Encounter: Payer: Self-pay | Admitting: *Deleted

## 2017-12-24 ENCOUNTER — Other Ambulatory Visit: Payer: Self-pay

## 2017-12-25 ENCOUNTER — Encounter: Payer: Self-pay | Admitting: Anesthesiology

## 2017-12-31 NOTE — Discharge Instructions (Signed)

## 2018-01-02 ENCOUNTER — Ambulatory Visit: Payer: Medicare Other | Admitting: Anesthesiology

## 2018-01-02 ENCOUNTER — Encounter
Admission: RE | Disposition: A | Discharge status: Home / Self Care | Payer: Self-pay | Source: Ambulatory Visit | Attending: Ophthalmology

## 2018-01-02 ENCOUNTER — Ambulatory Visit
Admission: RE | Admit: 2018-01-02 | Discharge: 2018-01-02 | Disposition: A | Discharge status: Home / Self Care | Payer: Medicare Other | Source: Ambulatory Visit | Attending: Ophthalmology | Admitting: Ophthalmology

## 2018-01-02 DIAGNOSIS — Z882 Allergy status to sulfonamides status: Secondary | ICD-10-CM | POA: Diagnosis not present

## 2018-01-02 DIAGNOSIS — Z888 Allergy status to other drugs, medicaments and biological substances status: Secondary | ICD-10-CM | POA: Insufficient documentation

## 2018-01-02 DIAGNOSIS — Z85828 Personal history of other malignant neoplasm of skin: Secondary | ICD-10-CM | POA: Insufficient documentation

## 2018-01-02 DIAGNOSIS — Z887 Allergy status to serum and vaccine status: Secondary | ICD-10-CM | POA: Diagnosis not present

## 2018-01-02 DIAGNOSIS — H2511 Age-related nuclear cataract, right eye: Secondary | ICD-10-CM | POA: Diagnosis present

## 2018-01-02 DIAGNOSIS — Z885 Allergy status to narcotic agent status: Secondary | ICD-10-CM | POA: Diagnosis not present

## 2018-01-02 DIAGNOSIS — Z79899 Other long term (current) drug therapy: Secondary | ICD-10-CM | POA: Diagnosis not present

## 2018-01-02 DIAGNOSIS — Z881 Allergy status to other antibiotic agents status: Secondary | ICD-10-CM | POA: Diagnosis not present

## 2018-01-02 DIAGNOSIS — M1990 Unspecified osteoarthritis, unspecified site: Secondary | ICD-10-CM | POA: Insufficient documentation

## 2018-01-02 DIAGNOSIS — J449 Chronic obstructive pulmonary disease, unspecified: Secondary | ICD-10-CM | POA: Diagnosis not present

## 2018-01-02 DIAGNOSIS — Z7982 Long term (current) use of aspirin: Secondary | ICD-10-CM | POA: Diagnosis not present

## 2018-01-02 DIAGNOSIS — Z87891 Personal history of nicotine dependence: Secondary | ICD-10-CM | POA: Diagnosis not present

## 2018-01-02 HISTORY — PX: CATARACT EXTRACTION W/PHACO: SHX586

## 2018-01-02 SURGERY — PHACOEMULSIFICATION, CATARACT, WITH IOL INSERTION
Anesthesia: Monitor Anesthesia Care | Site: Eye | Laterality: Right

## 2018-01-02 MED ORDER — NA HYALUR & NA CHOND-NA HYALUR 0.4-0.35 ML IO KIT
PACK | INTRAOCULAR | Status: DC | PRN
  Administered 2018-01-02: 1 mL via INTRAOCULAR

## 2018-01-02 MED ORDER — LIDOCAINE HCL (PF) 2 % IJ SOLN
INTRAOCULAR | Status: DC | PRN
  Administered 2018-01-02: 2 mL

## 2018-01-02 MED ORDER — EPINEPHRINE PF 1 MG/ML IJ SOLN
INTRAOCULAR | Status: DC | PRN
  Administered 2018-01-02: 76 mL via OPHTHALMIC

## 2018-01-02 MED ORDER — BRIMONIDINE TARTRATE-TIMOLOL 0.2-0.5 % OP SOLN
OPHTHALMIC | Status: DC | PRN
  Administered 2018-01-02: 1 [drp] via OPHTHALMIC

## 2018-01-02 MED ORDER — ARMC OPHTHALMIC DILATING DROPS
1.0000 "application " | OPHTHALMIC | Status: DC | PRN
  Administered 2018-01-02 (×3): 1 via OPHTHALMIC

## 2018-01-02 MED ORDER — POLYMYXIN B-TRIMETHOPRIM 10000-0.1 UNIT/ML-% OP SOLN
1.0000 [drp] | OPHTHALMIC | Status: DC | PRN
  Administered 2018-01-02 (×3): 1 [drp] via OPHTHALMIC

## 2018-01-02 MED ORDER — ONDANSETRON HCL 4 MG/2ML IJ SOLN: 4.0000 mg | Freq: Once | INTRAMUSCULAR | Status: DC | PRN

## 2018-01-02 MED ORDER — TETRACAINE HCL 0.5 % OP SOLN
1.0000 [drp] | OPHTHALMIC | Status: DC | PRN
  Administered 2018-01-02 (×2): 1 [drp] via OPHTHALMIC

## 2018-01-02 MED ORDER — MIDAZOLAM HCL 2 MG/2ML IJ SOLN
INTRAMUSCULAR | Status: DC | PRN
  Administered 2018-01-02: 1 mg via INTRAVENOUS
  Administered 2018-01-02: .5 mg via INTRAVENOUS

## 2018-01-02 MED ORDER — LACTATED RINGERS IV SOLN: 10.0000 mL/h | INTRAVENOUS | Status: DC

## 2018-01-02 MED ORDER — CEFUROXIME OPHTHALMIC INJECTION 1 MG/0.1 ML
INJECTION | OPHTHALMIC | Status: DC | PRN
  Administered 2018-01-02: 0.1 mL via INTRACAMERAL

## 2018-01-02 MED ORDER — FENTANYL CITRATE (PF) 100 MCG/2ML IJ SOLN
INTRAMUSCULAR | Status: DC | PRN
  Administered 2018-01-02: 50 ug via INTRAVENOUS

## 2018-01-02 SURGICAL SUPPLY — 20 items
CANNULA ANT/CHMB 27G (MISCELLANEOUS) ×1 IMPLANT
CANNULA ANT/CHMB 27GA (MISCELLANEOUS) ×3 IMPLANT
GLOVE SURG LX 7.5 STRW (GLOVE) ×2
GLOVE SURG LX STRL 7.5 STRW (GLOVE) ×1 IMPLANT
GLOVE SURG TRIUMPH 8.0 PF LTX (GLOVE) ×3 IMPLANT
GOWN STRL REUS W/ TWL LRG LVL3 (GOWN DISPOSABLE) ×2 IMPLANT
GOWN STRL REUS W/TWL LRG LVL3 (GOWN DISPOSABLE) ×4
LENS IOL TECNIS ITEC 19.0 (Intraocular Lens) ×2 IMPLANT
MARKER SKIN DUAL TIP RULER LAB (MISCELLANEOUS) ×3 IMPLANT
NDL FILTER BLUNT 18X1 1/2 (NEEDLE) ×1 IMPLANT
NEEDLE FILTER BLUNT 18X 1/2SAF (NEEDLE) ×2
NEEDLE FILTER BLUNT 18X1 1/2 (NEEDLE) ×1 IMPLANT
PACK CATARACT BRASINGTON (MISCELLANEOUS) ×3 IMPLANT
PACK EYE AFTER SURG (MISCELLANEOUS) ×3 IMPLANT
PACK OPTHALMIC (MISCELLANEOUS) ×3 IMPLANT
SYR 3ML LL SCALE MARK (SYRINGE) ×3 IMPLANT
SYR 5ML LL (SYRINGE) ×3 IMPLANT
SYR TB 1ML LUER SLIP (SYRINGE) ×3 IMPLANT
WATER STERILE IRR 500ML POUR (IV SOLUTION) ×3 IMPLANT
WIPE NON LINTING 3.25X3.25 (MISCELLANEOUS) ×3 IMPLANT

## 2018-01-02 NOTE — Transfer of Care (Signed)
Immediate Anesthesia Transfer of Care Note  Patient: Sara Stephenson  Procedure(s) Performed: CATARACT EXTRACTION PHACO AND INTRAOCULAR LENS PLACEMENT ([place]) right (Right Eye)  Patient Location: PACU  Anesthesia Type: MAC  Level of Consciousness: awake, alert  and patient cooperative  Airway and Oxygen Therapy: Patient Spontanous Breathing and Patient connected to supplemental oxygen  Post-op Assessment: Post-op Vital signs reviewed, Patient's Cardiovascular Status Stable, Respiratory Function Stable, Patent Airway and No signs of Nausea or vomiting  Post-op Vital Signs: Reviewed and stable  Complications: No apparent anesthesia complications

## 2018-01-02 NOTE — Anesthesia Preprocedure Evaluation (Signed)
Anesthesia Evaluation  Patient identified by MRN, date of birth, ID band Patient awake    Reviewed: Allergy & Precautions, H&P , NPO status , Patient's Chart, lab work & pertinent test results  Airway Mallampati: I  TM Distance: >3 FB Neck ROM: full    Dental no notable dental hx.    Pulmonary COPD, former smoker,    Pulmonary exam normal breath sounds clear to auscultation       Cardiovascular negative cardio ROS Normal cardiovascular exam     Neuro/Psych    GI/Hepatic Neg liver ROS, Medicated,  Endo/Other  negative endocrine ROS  Renal/GU      Musculoskeletal   Abdominal   Peds  Hematology negative hematology ROS (+)   Anesthesia Other Findings   Reproductive/Obstetrics                             Anesthesia Physical Anesthesia Plan  ASA: II  Anesthesia Plan: MAC   Post-op Pain Management:    Induction:   PONV Risk Score and Plan:   Airway Management Planned:   Additional Equipment:   Intra-op Plan:   Post-operative Plan:   Informed Consent: I have reviewed the patients History and Physical, chart, labs and discussed the procedure including the risks, benefits and alternatives for the proposed anesthesia with the patient or authorized representative who has indicated his/her understanding and acceptance.     Plan Discussed with:   Anesthesia Plan Comments:         Anesthesia Quick Evaluation

## 2018-01-02 NOTE — Anesthesia Postprocedure Evaluation (Signed)
Anesthesia Post Note  Patient: Sara Stephenson  Procedure(s) Performed: CATARACT EXTRACTION PHACO AND INTRAOCULAR LENS PLACEMENT ([place]) right (Right Eye)  Patient location during evaluation: PACU Anesthesia Type: MAC Level of consciousness: awake and alert Pain management: pain level controlled Vital Signs Assessment: post-procedure vital signs reviewed and stable Respiratory status: spontaneous breathing Cardiovascular status: blood pressure returned to baseline Anesthetic complications: no    Jaci Standard, III,  Navayah Sok D

## 2018-01-02 NOTE — Anesthesia Procedure Notes (Signed)
Procedure Name: MAC Performed by: Vincent Ehrler, CRNA Pre-anesthesia Checklist: Patient identified, Emergency Drugs available, Suction available, Timeout performed and Patient being monitored Patient Re-evaluated:Patient Re-evaluated prior to induction Oxygen Delivery Method: Nasal cannula Placement Confirmation: positive ETCO2       

## 2018-01-02 NOTE — Op Note (Signed)
LOCATION:  [place]   PREOPERATIVE DIAGNOSIS:    Nuclear sclerotic cataract right eye. H25.11   POSTOPERATIVE DIAGNOSIS:  Nuclear sclerotic cataract right eye.     PROCEDURE:  Phacoemusification with posterior chamber intraocular lens placement of the right eye   LENS:   Implant Name Type Inv. Item Serial No. Manufacturer Lot No. LRB No. Used  LENS IOL DIOP 19.0 - [id number] Intraocular Lens LENS IOL DIOP 19.0 [id number]  Right 1        ULTRASOUND TIME: 13 % of 1 minutes, 21 seconds.  CDE 10.4   SURGEON:  Wyonia Hough, MD   ANESTHESIA:  Topical with tetracaine drops and 2% Xylocaine jelly, augmented with 1% preservative-free intracameral lidocaine.    COMPLICATIONS:  None.   DESCRIPTION OF PROCEDURE:  The patient was identified in the holding room and transported to the operating room and placed in the supine position under the operating microscope.  The right eye was identified as the operative eye and it was prepped and draped in the usual sterile ophthalmic fashion.   A 1 millimeter clear-corneal paracentesis was made at the 12:00 position.  0.5 ml of preservative-free 1% lidocaine was injected into the anterior chamber. The anterior chamber was filled with Viscoat viscoelastic.  A 2.4 millimeter keratome was used to make a near-clear corneal incision at the 9:00 position.  A curvilinear capsulorrhexis was made with a cystotome and capsulorrhexis forceps.  Balanced salt solution was used to hydrodissect and hydrodelineate the nucleus.   Phacoemulsification was then used in stop and chop fashion to remove the lens nucleus and epinucleus.  The remaining cortex was then removed using the irrigation and aspiration handpiece. Provisc was then placed into the capsular bag to distend it for lens placement.  A lens was then injected into the capsular bag.  The remaining viscoelastic was aspirated.   Wounds were hydrated with balanced salt solution.  The anterior  chamber was inflated to a physiologic pressure with balanced salt solution.  No wound leaks were noted. Cefuroxime 0.1 ml of a 10mg /ml solution was injected into the anterior chamber for a dose of 1 mg of intracameral antibiotic at the completion of the case.   Timolol and Brimonidine drops were applied to the eye.  The patient was taken to the recovery room in stable condition without complications of anesthesia or surgery.   Kanon Colunga 01/02/2018, 11:00 AM

## 2018-01-02 NOTE — H&P (Signed)
The History and Physical notes are on paper, have been signed, and are to be scanned. The patient remains stable and unchanged from the H&P.   Previous H&P reviewed, patient examined, and there are no changes.  Sara Stephenson 01/02/2018 9:46 AM

## 2018-01-03 ENCOUNTER — Encounter: Payer: Self-pay | Admitting: Ophthalmology

## 2019-04-17 ENCOUNTER — Ambulatory Visit: Payer: Medicare Other

## 2019-05-08 ENCOUNTER — Ambulatory Visit: Payer: Medicare Other

## 2023-06-26 LAB — LAB REPORT - SCANNED: EGFR: 60

## 2023-07-17 ENCOUNTER — Other Ambulatory Visit: Payer: Self-pay | Admitting: Family Medicine

## 2023-07-17 DIAGNOSIS — M5416 Radiculopathy, lumbar region: Secondary | ICD-10-CM

## 2023-07-23 ENCOUNTER — Other Ambulatory Visit

## 2023-08-03 ENCOUNTER — Other Ambulatory Visit

## 2023-08-11 ENCOUNTER — Ambulatory Visit
Admission: RE | Admit: 2023-08-11 | Discharge: 2023-08-11 | Disposition: A | Discharge status: Home / Self Care | Source: Ambulatory Visit | Attending: Family Medicine | Admitting: Family Medicine

## 2023-08-11 DIAGNOSIS — M5416 Radiculopathy, lumbar region: Secondary | ICD-10-CM

## 2023-08-22 ENCOUNTER — Other Ambulatory Visit: Payer: Self-pay | Admitting: Family Medicine

## 2023-08-22 DIAGNOSIS — N2889 Other specified disorders of kidney and ureter: Secondary | ICD-10-CM

## 2023-08-23 ENCOUNTER — Ambulatory Visit
Admission: RE | Admit: 2023-08-23 | Discharge: 2023-08-23 | Disposition: A | Discharge status: Home / Self Care | Source: Ambulatory Visit | Attending: Family Medicine | Admitting: Family Medicine

## 2023-08-23 DIAGNOSIS — N2889 Other specified disorders of kidney and ureter: Secondary | ICD-10-CM | POA: Diagnosis present

## 2023-08-23 MED ORDER — GADOBUTROL 1 MMOL/ML IV SOLN
7.0000 mL | Freq: Once | INTRAVENOUS | Status: AC | PRN
  Administered 2023-08-23: 7 mL via INTRAVENOUS

## 2023-10-29 ENCOUNTER — Telehealth: Payer: Self-pay | Admitting: Family Medicine

## 2023-10-29 NOTE — Telephone Encounter (Signed)
 Ok to find her a new patient appt (40 min) as she is a family member of a current patient.

## 2023-10-29 NOTE — Telephone Encounter (Signed)
 Copied from CRM [id number]. Topic: Appointments - Scheduling Inquiry for Clinic >> Oct 29, 2023  1:18 PM Delon HERO wrote:  Do you want us  to make her a new patient appointment? Reason for CRM: Patient's granddaughter, Annabella Captain is calling to scheduling her grandmother a new patient appointment with Dr. KATHEE. Annabella is a current patient of Dr. B. Please advise with Tiffany [phone]

## 2023-10-30 NOTE — Telephone Encounter (Signed)
 Appt scheduled for 12/06/2023 at 240pm

## 2023-12-06 ENCOUNTER — Ambulatory Visit: Admitting: Family Medicine

## 2023-12-10 ENCOUNTER — Encounter: Payer: Self-pay | Admitting: Family Medicine

## 2023-12-10 ENCOUNTER — Ambulatory Visit (INDEPENDENT_AMBULATORY_CARE_PROVIDER_SITE_OTHER): Admitting: Family Medicine

## 2023-12-10 VITALS — BP 134/64 | HR 95 | Ht 63.0 in | Wt 149.7 lb

## 2023-12-10 DIAGNOSIS — Z85828 Personal history of other malignant neoplasm of skin: Secondary | ICD-10-CM

## 2023-12-10 DIAGNOSIS — H9113 Presbycusis, bilateral: Secondary | ICD-10-CM | POA: Diagnosis not present

## 2023-12-10 DIAGNOSIS — E782 Mixed hyperlipidemia: Secondary | ICD-10-CM

## 2023-12-10 DIAGNOSIS — M479 Spondylosis, unspecified: Secondary | ICD-10-CM

## 2023-12-10 DIAGNOSIS — J418 Mixed simple and mucopurulent chronic bronchitis: Secondary | ICD-10-CM | POA: Diagnosis not present

## 2023-12-10 DIAGNOSIS — R630 Anorexia: Secondary | ICD-10-CM

## 2023-12-10 DIAGNOSIS — Z23 Encounter for immunization: Secondary | ICD-10-CM

## 2023-12-10 NOTE — Assessment & Plan Note (Signed)
 Lumbar radiculopathy with a history of a pinched nerve confirmed by MRI. Previous injection by Dr. Starlin provided relief but symptoms are returning as the effect wears off. - Contact Dr. Starlin for further management and potential repeat injection

## 2023-12-10 NOTE — Assessment & Plan Note (Signed)
 Well controlled on last labs Check annually Not on statin Can continue OTC management

## 2023-12-10 NOTE — Assessment & Plan Note (Signed)
 COPD managed with inhalers, no recent exacerbations or symptoms of wheezing or increased sputum production. Current regimen includes albuterol  as needed and Atrovent three times daily. Discussed potential for switching Atrovent to a combination inhaler if symptoms worsen, but current management is effective. - Continue albuterol  as needed and Atrovent three times daily - Discuss potential inhaler changes with pulmonologist if symptoms worsen

## 2023-12-10 NOTE — Progress Notes (Signed)
 New patient visit   Patient: Sara Stephenson   DOB: 1932-06-04   89 y.o. Female  MRN: [id number] Visit Date: 12/10/2023  Today's healthcare provider: Jon Eva, MD   Chief Complaint  Patient presents with   New Patient (Initial Visit)    Patient is present to establish care since last PCP has retired. She reports no concerns today   Subjective    Sara Stephenson is a 89 y.o. female who presents today as a new patient to establish care.   Discussed the use of AI scribe software for clinical note transcription with the patient, who gave verbal consent to proceed.  History of Present Illness   Sara Stephenson is an 89 year old female with COPD who presents for a new patient visit and routine follow-up.  She manages her COPD with albuterol  as needed and Atrovent, two inhalations three times a day. She consults a pulmonologist biannually. She experiences annual sinus infections, treated with antibiotics, but has had no recent episodes.  She has arthritis and spondylosis, with a pinched nerve in her back. An injection previously provided relief, but symptoms are returning as the effect diminishes.  She has a history of basal cell carcinoma with lesions removed from her nose and back. She continues dermatological follow-up but is considering a new dermatologist due to travel issues.  Her past medical history includes cataract surgery, retinal detachment repair, kidney stone passage, appendectomy, tonsillectomy, and a breast biopsy. She quit smoking in the 1990s and denies current tobacco, alcohol, or drug use. 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 She maintains close relationships with her family.  Her supplements include baby aspirin, biotin, B12, calcium and vitamin D, magnesium, a multivitamin, niacin, and red yeast rice.         Past Medical History:  Diagnosis Date   Allergy    Arthritis    Cancer (HCC)    skin cancer   COPD (chronic obstructive pulmonary  disease) (HCC)    Kidney stone    Rib fracture, right, with routine healing, subsequent encounter 05/2010   fall injury   Past Surgical History:  Procedure Laterality Date   APPENDECTOMY     BREAST BIOPSY  1954   left benign   CATARACT EXTRACTION W/PHACO Left 12/05/2017   Procedure: CATARACT EXTRACTION PHACO AND INTRAOCULAR LENS PLACEMENT (IOC) LEFT;  Surgeon: Mittie Gaskin, MD;  Location: [facility];  Service: Ophthalmology;  Laterality: Left;   CATARACT EXTRACTION W/PHACO Right 01/02/2018   Procedure: CATARACT EXTRACTION PHACO AND INTRAOCULAR LENS PLACEMENT (IOC) right;  Surgeon: Mittie Gaskin, MD;  Location: [facility];  Service: Ophthalmology;  Laterality: Right;   RETINAL DETACHMENT SURGERY  1974   right   SQUAMOUS CELL CARCINOMA EXCISION  2012   right bridge of nose   TONSILLECTOMY     Family Status  Relation Name Status   Mother  Deceased       osteoporosis   Father  Deceased at age 29       recurrent parathyroid cancer   Sister  Alive       half sister   Sister  Alive       half sister   Brother  Alive       half brother   MGM  (Not Specified)   MGF  (Not Specified)   PGM  (Not Specified)   PGF  (Not Specified)   Neg Hx  (Not Specified)  No partnership data on file  Family History  Problem Relation Age of Onset   Arthritis-Osteo Mother    Osteoporosis Mother    Aneurysm Mother    Arthritis Father    Diabetes Maternal Grandmother    Heart Problems Maternal Grandfather    Leukemia Paternal Grandmother    Alcohol abuse Paternal Grandfather    Breast cancer Neg Hx    Social History   Socioeconomic History   Marital status: Widowed    Spouse name: Not on file   Number of children: 3   Years of education: Not on file   Highest education level: Not on file  Occupational History   Occupation: Advertising account executive  Tobacco Use   Smoking status: Former    Current packs/day: 0.00    Average packs/day: 0.5 packs/day for 33.0 years  (16.5 ttl pk-yrs)    Types: Cigarettes    Start date: 05/11/1959    Quit date: 05/10/1992    Years since quitting: 31.6   Smokeless tobacco: Never  Vaping Use   Vaping status: Never Used  Substance and Sexual Activity   Alcohol use: No   Drug use: No   Sexual activity: Not on file  Other Topics Concern   Not on file  Social History Narrative   Lives close to mother who is still in her own home   Daughter, Veva, in [place]   Social Drivers of Health   Financial Resource Strain: Low Risk  (08/07/2023)   Received from [facility]   Overall Financial Resource Strain (CARDIA)    Difficulty of Paying Living Expenses: Not very hard  Food Insecurity: No Food Insecurity (08/07/2023)   Received from [facility]   Hunger Vital Sign    Within the past 12 months, you worried that your food would run out before you got the money to buy more.: Never true    Within the past 12 months, the food you bought just didn't last and you didn't have money to get more.: Never true  Transportation Needs: No Transportation Needs (08/07/2023)   Received from [facility]    In the past 12 months, has lack of transportation kept you from medical appointments or from getting medications?: No    Lack of Transportation (Non-Medical): No  Physical Activity: Not on file  Stress: Not on file  Social Connections: Not on file   Outpatient Medications Prior to Visit  Medication Sig   albuterol  (PROAIR  HFA) 108 (90 BASE) MCG/ACT inhaler Inhale 2 puffs into the lungs every 6 (six) hours as needed for wheezing. (Patient taking differently: Inhale 1-2 puffs into the lungs 3 (three) times daily. )   aspirin EC 81 MG tablet Take 81 mg by mouth daily.   Biotin 89999 MCG TBDP Take 10,000 mcg by mouth daily.   Calcium Carb-Cholecalciferol (CALTRATE 600+D3 SOFT PO) Take 1 tablet by mouth 2 (two) times daily.   cyanocobalamin 1000 MCG tablet Take  1,000 mcg by mouth daily.   ibuprofen (ADVIL,MOTRIN) 200 MG tablet Take 400-800 mg by mouth 3 (three) times daily as needed (for pain.).    ipratropium (ATROVENT HFA) 17 MCG/ACT inhaler Inhale 1 puff into the lungs 3 (three) times daily.   Magnesium 250 MG TABS Take by mouth.   Multiple Vitamins-Minerals (CENTRUM SILVER WOMEN 50+) TABS Take by mouth.   niacin (VITAMIN B3) 500 MG tablet Take 500 mg by mouth every other day.   Red Yeast Rice 600 MG CAPS Take 600  mg by mouth daily.   No facility-administered medications prior to visit.   Allergies  Allergen Reactions   Tramadol  Other (See Comments)    Did not help with the pain   Sulfonamide Derivatives Dermatitis    REACTION: hypotension, red blotchy patches on skin   Anoro Ellipta [Umeclidinium-Vilanterol] Other (See Comments)    Chest pain    Hydrocodone Itching   Levofloxacin Other (See Comments)    Ruptured tendon right bicep    Pneumococcal Vaccine Other (See Comments)    REACTION: Sore arm, visual changes, Racing Heart Beat   Pneumococcal Vaccine Polyvalent Other (See Comments)    REACTION: Sore arm, visual changes, difficulty breathing   Codeine Rash and Dermatitis    REACTION: rash and headache  Red Blotchy Skin all over   Spiriva  [Tiotropium Bromide  Monohydrate] Other (See Comments)     nasal congestion and numbness of her upper lip.    Review of Systems      Objective    BP 134/64 (BP Location: Left Arm, Cuff Size: Normal)   Pulse 95   Ht 5' 3 (1.6 m)   Wt 149 lb 11.2 oz (67.9 kg)   SpO2 100%   BMI 26.52 kg/m     Physical Exam Vitals reviewed.  Constitutional:      General: She is not in acute distress.    Appearance: Normal appearance. She is well-developed. She is not diaphoretic.  HENT:     Head: Normocephalic and atraumatic.  Eyes:     General: No scleral icterus.    Conjunctiva/sclera: Conjunctivae normal.  Neck:     Thyroid: No thyromegaly.  Cardiovascular:     Rate and Rhythm: Normal  rate and regular rhythm.     Heart sounds: Normal heart sounds. No murmur heard. Pulmonary:     Effort: Pulmonary effort is normal. No respiratory distress.     Breath sounds: Normal breath sounds. No wheezing, rhonchi or rales.  Musculoskeletal:     Cervical back: Neck supple.     Right lower leg: No edema.     Left lower leg: No edema.  Lymphadenopathy:     Cervical: No cervical adenopathy.  Skin:    General: Skin is warm and dry.     Findings: No rash.  Neurological:     Mental Status: She is alert and oriented to person, place, and time. Mental status is at baseline.  Psychiatric:        Mood and Affect: Mood normal.        Behavior: Behavior normal.     Depression Screen    12/10/2023    1:14 PM 11/28/2011    1:46 PM 08/29/2011    2:27 PM 07/27/2011    8:46 AM  PHQ 2/9 Scores  PHQ - 2 Score 0 0  0  0   PHQ- 9 Score 1        Data saved with a previous flowsheet row definition   No results found for any visits on 12/10/23.  Assessment & Plan      Problem List Items Addressed This Visit       Respiratory   COPD (chronic obstructive pulmonary disease) (HCC)   COPD managed with inhalers, no recent exacerbations or symptoms of wheezing or increased sputum production. Current regimen includes albuterol  as needed and Atrovent three times daily. Discussed potential for switching Atrovent to a combination inhaler if symptoms worsen, but current management is effective. - Continue albuterol  as needed and Atrovent three times daily -  Discuss potential inhaler changes with pulmonologist if symptoms worsen        Nervous and Auditory   Presbycusis of both ears     Musculoskeletal and Integument   Spondylosis   Lumbar radiculopathy with a history of a pinched nerve confirmed by MRI. Previous injection by Dr. Starlin provided relief but symptoms are returning as the effect wears off. - Contact Dr. Starlin for further management and potential repeat injection       History of basal cell carcinoma (BCC)   Relevant Orders   Ambulatory referral to Dermatology     Other   Hyperlipidemia - Primary   Well controlled on last labs Check annually Not on statin Can continue OTC management      Other Visit Diagnoses       Immunization due       Relevant Orders   Flu vaccine HIGH DOSE PF(Fluzone Trivalent) (Completed)     Decreased appetite              Osteoarthritis of right ankle Chronic osteoarthritis in the right ankle with pain exacerbated by walking. Previous injection by Dr. Lennie was ineffective. No current swelling. - Avoid further injections as previous attempts were ineffective  H/o Basal cell carcinoma of skin (nose and back) Basal cell carcinoma on the nose and back, previously treated. Currently under dermatological surveillance with Dr. Arlyss, but considering a change due to travel inconvenience. - Refer to [facility] in [place] for dermatological follow-up - Advise her to keep upcoming appointment with Dr. Arlyss if scheduled, due to long wait times for new appointments  Hearing loss Hearing loss with difficulty understanding words despite use of hearing aids. Hearing aids amplify sound but do not improve word comprehension.  Impaired appetite Decreased appetite with reduced portion sizes, possibly related to age-related changes in taste and appetite. Weight is stable, and no significant concerns noted. - Monitor weight and nutritional intake - Reassure her about changes in appetite and eating habits         Return in about 7 months (around 07/09/2024) for CPE, AWV.      I personally spent a total of 50 minutes in the care of the patient today including preparing to see the patient, getting/reviewing separately obtained history, performing a medically appropriate exam/evaluation, counseling and educating, placing orders, referring and communicating with other health care professionals, documenting clinical  information in the EHR, independently interpreting results, and coordinating care.   Jon Eva, MD  [facility] [phone] (phone) [phone] (fax)  [facility]

## 2023-12-17 ENCOUNTER — Encounter: Payer: Self-pay | Admitting: Internal Medicine

## 2023-12-31 ENCOUNTER — Ambulatory Visit

## 2024-01-16 ENCOUNTER — Ambulatory Visit

## 2024-07-10 ENCOUNTER — Ambulatory Visit: Admitting: Family Medicine
